# Patient Record
Sex: Female | Born: 1970 | Race: White | Hispanic: No | Marital: Married | Smoking: Never smoker
Health system: Southern US, Community
[De-identification: ages and names within clinical notes are randomized; demographics above are authoritative.]

## PROBLEM LIST (undated history)

## (undated) DIAGNOSIS — N951 Menopausal and female climacteric states: Secondary | ICD-10-CM

## (undated) DIAGNOSIS — G4700 Insomnia, unspecified: Secondary | ICD-10-CM

## (undated) DIAGNOSIS — F419 Anxiety disorder, unspecified: Secondary | ICD-10-CM

## (undated) DIAGNOSIS — N3010 Interstitial cystitis (chronic) without hematuria: Secondary | ICD-10-CM

## (undated) DIAGNOSIS — F32A Depression, unspecified: Secondary | ICD-10-CM

## (undated) DIAGNOSIS — K648 Other hemorrhoids: Secondary | ICD-10-CM

## (undated) DIAGNOSIS — K589 Irritable bowel syndrome without diarrhea: Secondary | ICD-10-CM

## (undated) DIAGNOSIS — F329 Major depressive disorder, single episode, unspecified: Secondary | ICD-10-CM

## (undated) DIAGNOSIS — K602 Anal fissure, unspecified: Secondary | ICD-10-CM

## (undated) HISTORY — PX: DILATION AND CURETTAGE OF UTERUS: SHX78

## (undated) HISTORY — PX: FISSURECTOMY: SHX5244

## (undated) HISTORY — DX: Interstitial cystitis (chronic) without hematuria: N30.10

## (undated) HISTORY — PX: SPHINCTEROTOMY: SHX5279

## (undated) HISTORY — DX: Anxiety disorder, unspecified: F41.9

## (undated) HISTORY — PX: DENTAL SURGERY: SHX609

## (undated) HISTORY — DX: Major depressive disorder, single episode, unspecified: F32.9

## (undated) HISTORY — DX: Irritable bowel syndrome, unspecified: K58.9

## (undated) HISTORY — DX: Depression, unspecified: F32.A

## (undated) HISTORY — DX: Insomnia, unspecified: G47.00

## (undated) HISTORY — DX: Menopausal and female climacteric states: N95.1

## (undated) HISTORY — PX: BREAST BIOPSY: SHX20

## (undated) HISTORY — DX: Anal fissure, unspecified: K60.2

## (undated) HISTORY — DX: Other hemorrhoids: K64.8

---

## 2000-10-16 ENCOUNTER — Ambulatory Visit (HOSPITAL_COMMUNITY): Admission: RE | Admit: 2000-10-16 | Discharge: 2000-10-16 | Payer: Self-pay | Admitting: Family Medicine

## 2000-10-16 ENCOUNTER — Encounter: Payer: Self-pay | Admitting: Family Medicine

## 2001-07-02 ENCOUNTER — Other Ambulatory Visit: Admission: RE | Admit: 2001-07-02 | Discharge: 2001-07-02 | Payer: Self-pay | Admitting: *Deleted

## 2001-11-07 ENCOUNTER — Encounter: Payer: Self-pay | Admitting: *Deleted

## 2001-11-07 ENCOUNTER — Ambulatory Visit (HOSPITAL_COMMUNITY): Admission: RE | Admit: 2001-11-07 | Discharge: 2001-11-07 | Payer: Self-pay | Admitting: *Deleted

## 2005-02-15 ENCOUNTER — Ambulatory Visit: Payer: Self-pay | Admitting: Family Medicine

## 2005-03-07 ENCOUNTER — Ambulatory Visit: Payer: Self-pay | Admitting: Internal Medicine

## 2005-03-16 ENCOUNTER — Ambulatory Visit: Payer: Self-pay | Admitting: Internal Medicine

## 2005-12-14 ENCOUNTER — Ambulatory Visit: Payer: Self-pay | Admitting: Internal Medicine

## 2006-01-18 ENCOUNTER — Ambulatory Visit: Payer: Self-pay | Admitting: Family Medicine

## 2006-01-19 ENCOUNTER — Encounter: Admission: RE | Admit: 2006-01-19 | Discharge: 2006-01-19 | Payer: Self-pay | Admitting: Family Medicine

## 2006-07-11 ENCOUNTER — Ambulatory Visit: Payer: Self-pay | Admitting: Family Medicine

## 2007-01-08 ENCOUNTER — Observation Stay (HOSPITAL_COMMUNITY): Admission: AD | Admit: 2007-01-08 | Discharge: 2007-01-09 | Payer: Self-pay | Admitting: Family Medicine

## 2007-01-08 ENCOUNTER — Ambulatory Visit: Payer: Self-pay | Admitting: Family Medicine

## 2007-01-09 ENCOUNTER — Ambulatory Visit: Payer: Self-pay | Admitting: Internal Medicine

## 2007-01-11 ENCOUNTER — Ambulatory Visit: Payer: Self-pay | Admitting: Family Medicine

## 2007-01-11 ENCOUNTER — Ambulatory Visit: Payer: Self-pay | Admitting: Internal Medicine

## 2007-01-11 ENCOUNTER — Ambulatory Visit: Payer: Self-pay | Admitting: Cardiovascular Disease

## 2007-02-18 ENCOUNTER — Ambulatory Visit: Payer: Self-pay | Admitting: Family Medicine

## 2007-02-19 ENCOUNTER — Encounter: Payer: Self-pay | Admitting: Family Medicine

## 2008-12-25 DIAGNOSIS — K602 Anal fissure, unspecified: Secondary | ICD-10-CM

## 2008-12-25 HISTORY — DX: Anal fissure, unspecified: K60.2

## 2009-04-10 ENCOUNTER — Ambulatory Visit: Payer: Self-pay | Admitting: Family Medicine

## 2009-04-10 DIAGNOSIS — S90859A Superficial foreign body, unspecified foot, initial encounter: Secondary | ICD-10-CM

## 2009-04-10 DIAGNOSIS — L089 Local infection of the skin and subcutaneous tissue, unspecified: Secondary | ICD-10-CM | POA: Insufficient documentation

## 2011-05-12 NOTE — Discharge Summary (Signed)
NAMESAHMYA, ARAI            ACCOUNT NO.:  [id number]   MEDICAL RECORD NO.:  [id number]          PATIENT TYPE:  OBV   LOCATION:  5152                         FACILITY:  [facility]   PHYSICIAN:  Beth Hernandez, MDDATE OF BIRTH:  [birth year]   DATE OF ADMISSION:  01/08/2007  DATE OF DISCHARGE:  01/09/2007                               DISCHARGE SUMMARY   DISCHARGE DIAGNOSES:  1. Abdominal and back discomfort likely secondary to irritable bowel      syndrome.  2. Low normal blood pressure at baseline.  3. Reported history of transient blood in stool, fecal occult blood      negative in hospital   HISTORY OF PRESENT ILLNESS:  Beth Hernandez is a 40 year old white female  with a history of hemorrhoids who presented with chief complaint of back  pain for two days.  She also noted some complaints of dizziness, bright  red blood per rectum, and stomach pain. She stated that she had a 1-  month history of stomach pain and back pain which occurred shortly after  bowel movements. Prior to that, she noted a 2-week history of diarrhea  which resolved with time and Imodium.  She had bright red blood per  rectum off and on the day prior to admission. She was admitted for  further evaluation and treatment.   PAST MEDICAL HISTORY:  1. Hemorrhoids.  2. Depression.   COURSE OF HOSPITALIZATION.:  ABDOMINAL AND BACK DISCOMFORT WITH BOWEL MOVEMENT:  The patient was  admitted. Upon further questioning, it was felt that the patient's  symptoms were most consistent with irritable bowel syndrome. Referral  was made for an outpatient GI followup with Dr. Lina Hernandez. Also of  note, the patient was fecal occult blood negative and remained  hemodynamically stable during this admission.   She was noted to have low-normal blood pressures during this admission  which were asymptomatic and consistent with the patient's baseline blood  pressure.   DISCHARGE MEDICATIONS:  1. Zoloft 100 mg p.o. daily.  2. Birth control pills as prior to admission.  3. Tetracycline 500 mg p.o. b.i.d.  4. Sulfacet cream as needed.   DISPOSITION:  The patient was instructed to follow up with Dr. Laury Hernandez in  1 week and contact the office for an appointment, and to follow up with  Dr. Lina Hernandez on Friday, January 18, at 11:15 a.m. She was noted to  follow up  sooner with Dr. Laury Hernandez if she develops fever over 101, complaints  including nausea, vomiting, diarrhea, weakness, or abdominal pain.   PERTINENT LABORATORIES AT TIME OF DISCHARGE:  BUN 8, creatinine 0.6,  hemoglobin 13.5.      Beth Craze, NP      Beth Rover. Felicity Coyer, MD  Electronically Signed    MO/MEDQ  D:  04/01/2007  T:  04/01/2007  Job:  [id number]

## 2011-05-12 NOTE — H&P (Signed)
Beth Hernandez, Beth Hernandez            ACCOUNT NO.:  [id number]   MEDICAL RECORD NO.:  [id number]          PATIENT TYPE:  OBV   LOCATION:  5152                         FACILITY:  [facility]   PHYSICIAN:  Lelon Perla, DO    DATE OF BIRTH:  [birth year]   DATE OF ADMISSION:  01/08/2007  DATE OF DISCHARGE:                              HISTORY & PHYSICAL   ADMITTING DIAGNOSIS:  Gastrointestinal bleed.   The patient is a 40 year old white female who presented to the office  today with back pain for 2 days, bright red blood per rectum, stomach  pain and dizziness.  She states that she had a 54-month history of pain  in the stomach and back shortly after a bowel movement, but states the  pain was preceded by a 2-week history of diarrhea, which resolved with  time and Imodium.  The patient states that the pain in the stomach,  after the bowel movement, is new and the pain in the back is also new.  She reports a new 2-day history of constant back pain that is dull,  achy, and seems different than the back pain she has had before.  She  also reports a lot of dizziness for the last 2 weeks that is not normal  for her and complains of bright red blood per rectum yesterday, which  she thought at the time was from her hemorrhoids that she has a history  of, but they are not bleeding, according to the patient.  She denies  hemoptysis and no nausea or vomiting.   PAST MEDICAL HISTORY:  1. Hemorrhoids.  2. Depression.   MEDICATIONS:  Include:  1. Zoloft 100 mg a day.  2. Birth control pills, Trivora.  3. She, on an as needed basis for rosacea, has used tetracycline and      Sulfacet.   ALLERGIES:  SHE IS ALLERGIC TO:  1. CIPRO.  2. KEFLEX, WHICH CAUSES HIVES.  3. PENICILLIN.   REVIEW OF SYSTEMS:  As above.   PHYSICAL EXAMINATION:  VITAL SIGNS:  Her weight is 108.4.  Pulse 76.  Respirations 20.  Blood pressure is 104/76.  Orthostatics were done  lying down a blood pressure is 110/70, pulse is  76.  Sitting, blood  pressure is 98/55 and standing 90/70.  Patient became extremely dizzy  and almost passed out while orthostatics were being done.  HEENT:  Head:  Normocephalic, atraumatic.  Eyes:  Pupils equal and  reactive to light.  Tympanic membranes are intact bilaterally.  Oropharynx is clear.  NECK:  Supple.  No JVD.  No bruits.  HEART:  Positive S1 and S2.  No murmurs are appreciated.  LUNGS:  Clear bilaterally.  No rales, rhonchi or wheezing.  ABDOMEN:  Soft and nontender.  No masses palpated.  No CVA tenderness.  No organomegaly.  RECTAL EXAM:  Was done with heme positive brown stool with small  external hemorrhoids seen that were not bleeding.  MUSCULOSKELETAL EXAM:  There are no abnormalities.   LABS:  Hemoglobin, by finger stick, was done and was 15.5.  UA was  negative.  Pregnancy test was  negative.   ASSESSMENT/PLAN:  Gastrointestinal bleed, secondary to the patient  becoming orthostatic.  The patient was admitted for 24-hour observation  to do serial hemoglobin and hematocrit and IV fluids.  She will be  discharged home tomorrow if stable and will get a gastrointestinal  workup as an outpatient.      Lelon Perla, DO     YRL/MEDQ  D:  01/08/2007  T:  01/09/2007  Job:  [id number]

## 2011-05-12 NOTE — Assessment & Plan Note (Signed)
[facility]                         GASTROENTEROLOGY OFFICE NOTE   TIYA, SCHRUPP                   MRN:          [id number]  DATE:01/11/2007                            DOB:          10-30-1971    Beth Hernandez is a very nice 40 year old white female who is here today as an  acute working because of acute abdominal pain.  She was admitted to  [facility] for observation 3 days ago after presenting with  low abdominal pain, rectal bleeding, dizziness.  These symptoms have  been gradually increasing for about a period of past 2 months.  She had  episode diarrhea over New Years, otherwise she had constipation.  There  has been abdominal pain after meals and low back pain, almost  constantly.  Her blood chemistries were normal.  We saw Roshanna in March  2006 for episode of large volume of hematochezia and family history of  colorectal neoplasia in her grandparent.  A colonoscopy at that time  showed 1st grade internal hemorrhoids, no evidence of colitis.   MEDICATIONS:  1. Zoloft 100 mg p.o. daily.  2. Trivora birth control pills daily.  3. Senokot.  4. Fiber pills.  5. Tylenol.   PAST HISTORY:  Significant for depression, D&C after delivery and she  had dental surgery.   FAMILY HISTORY:  Is positive for colon cancer in grandparents, breast  cancer paternal grandmother, alcoholism in father.   SOCIAL HISTORY:  Married, one child.  She is a Futures trader.  She keeps  children in her house.  She does not smoke, drinks alcohol occasionally.   REVIEW OF SYSTEMS:  Positive for sleeping problems, vision changes, back  pain.   PHYSICAL EXAMINATION:  Blood pressure 80/50, pulse of 58 and weight 108  pounds.  She appeared thin, frail, somewhat depressed.  Alert and oriented and very cooperative.  Sclerae is nonicteric.  NECK:  Was supple with no adenopathy.  LUNGS:  Were clear to auscultation.  COR:  With normal S1, normal S2.  ABDOMEN:   Was soft, scaphoid with decreased bowel sounds.  Tenderness in  the right lower quadrant but more so in the left upper quadrant around  the splenic flexure without rebound or mass.  RECTAL:  An anoscopic exam shows normal perianal area, 1st grade  internal hemorrhoids which were hyperemic with actually blood clot on  the internal hemorrhoid at 9 o'clock.  No active bleeding.  Stool itself  was Hemoccult negative.  EXTREMITIES:  No edema.   IMPRESSION:  A 40 year old white female with rather acute onset of  abdominal pain as well as chronic abdominal symptoms for past 2 months.  Rectal bleeding, clearly from hemorrhoids.  Rule out ischemic colitis,  rule out exacerbation of irritable bowel syndrome, rule out bacterial  overgrowht or viral gastroenteritis.  She appears to be depressed.   PLAN:  1. A CT scan of the abdomen and pelvis today to rule out ischemic      colitis or segmental colitis, specifically left splenic flexure.  2. Stay on full liquids for the next 48 hours.  3. Anusol HC suppositories nightly.  4.  At this point discontinue Senokot and fiber which may be      irritating.  5. After the CT scan has been obtained I will  discuss the results.      For now, i would like her to take Levsin sublingually 0.125 mg 3      times a day before meals.     Hedwig Morton. Juanda Chance, MD  Electronically Signed    DMB/MedQ  DD: 01/11/2007  DT: 01/11/2007  Job #: [id number]   cc:   Loreen Freud, M.D.  Willow Ora, MD

## 2012-01-24 ENCOUNTER — Encounter: Payer: Self-pay | Admitting: Internal Medicine

## 2012-02-16 ENCOUNTER — Encounter: Payer: Self-pay | Admitting: *Deleted

## 2012-02-19 ENCOUNTER — Encounter: Payer: Self-pay | Admitting: *Deleted

## 2012-02-27 ENCOUNTER — Encounter: Payer: Self-pay | Admitting: Internal Medicine

## 2012-02-27 ENCOUNTER — Ambulatory Visit (INDEPENDENT_AMBULATORY_CARE_PROVIDER_SITE_OTHER): Payer: 59 | Admitting: Internal Medicine

## 2012-02-27 VITALS — BP 98/50 | HR 68 | Ht 64.0 in | Wt 116.5 lb

## 2012-02-27 DIAGNOSIS — K5900 Constipation, unspecified: Secondary | ICD-10-CM

## 2012-02-27 DIAGNOSIS — K589 Irritable bowel syndrome without diarrhea: Secondary | ICD-10-CM

## 2012-02-27 NOTE — Patient Instructions (Addendum)
You will be due for a recall colonoscopy in 09/2014. We will send you a reminder in the mail when it gets closer to that time. Dr Laury Axon

## 2012-02-27 NOTE — Progress Notes (Signed)
Beth Hernandez Jan 29, 1971 MRN [id number]  History of Present Illness:  This is a 41 year old white female that irritable bowel syndrome with predominant constipation. She had a colonoscopy in March 2006 with findings of internal hemorrhoids. She has a positive family history of colon cancer in a grandparent and history of colon polyps in her mother. Her twin sister just had breast cancer. Patient is concerned about needing  another colonoscopy. She is 41 years old. She has difficulty evacuating stool. She takes MiraLax 17 g daily and stool softeners. She has recently changed her dietary habits to include more fiber.   Past Medical History  Diagnosis Date  . Depression   . Internal hemorrhoids   . Insomnia   . Perimenopause   . Anal fissure 2010  . Anxiety   . Cystitis, interstitial   . IBS (irritable bowel syndrome)    Past Surgical History  Procedure Date  . Dilation and curettage of uterus   . Dental surgery   . Fissurectomy     with sphincterotomy    reports that she has never smoked. She has never used smokeless tobacco. She reports that she drinks alcohol. She reports that she does not use illicit drugs. family history includes Alcohol abuse in her father; Breast cancer in her paternal aunt, paternal grandmother, and sister; Colon cancer in her maternal grandmother; Colon polyps in her mother; Diabetes in her maternal aunt; Esophageal cancer in her maternal grandfather; Irritable bowel syndrome in her father; Lung cancer in her maternal aunt; and Other in her father. Allergies  Allergen Reactions  . Cephalexin     REACTION: hives  . Ciprofloxacin     REACTION: mental problems  . Penicillins     REACTION: itching and tingling        Review of Systems: Denies heartburn dysphagia or chest pain or shortness of breath  The remainder of the 10 point ROS is negative except as outlined in H&P   Physical Exam: General appearance  Well developed, in no distress. Eyes- non  icteric. HEENT nontraumatic, normocephalic. Mouth no lesions, tongue papillated, no cheilosis. Neck supple without adenopathy, thyroid not enlarged, no carotid bruits, no JVD. Lungs Clear to auscultation bilaterally. Cor normal S1, normal S2, regular rhythm, no murmur,  quiet precordium. Abdomen: Soft nontender with normal active bowel sounds. No distention. Liver edge at costal margin. No palpable mass Rectal: Normal rectal sphincter tone. Palpable heart nodule in the anal canal likely from a hemorrhoid. Stool is solid Hemoccult-negative. There is no prolapse Extremities no pedal edema. Skin no lesions. Neurological alert and oriented x 3. Psychological normal mood and affect.  Assessment and Plan:  Problem #1 Irritable bowel syndrome with predominant constipation. She will continue MiraLax 17 g daily and we also discussed adding a probiotic, one daily. I have offered Amitiza but she thinks she already tried in the past.  Problem #2 Family history of colorectal cancer in a direct relative. There is a positive family history of colon polyps in a direct relative. She is only 41 years old. I told her that I would not do a colonoscopy before age 69 unless she has specific symptoms that require colonoscopy. We will send her a recall letter in 2015.   02/27/2012 Beth Hernandez

## 2013-06-04 ENCOUNTER — Other Ambulatory Visit (HOSPITAL_BASED_OUTPATIENT_CLINIC_OR_DEPARTMENT_OTHER): Payer: Self-pay | Admitting: *Deleted

## 2013-06-04 ENCOUNTER — Other Ambulatory Visit (HOSPITAL_COMMUNITY): Payer: Self-pay | Admitting: *Deleted

## 2013-06-04 DIAGNOSIS — N926 Irregular menstruation, unspecified: Secondary | ICD-10-CM

## 2013-06-06 ENCOUNTER — Ambulatory Visit (HOSPITAL_BASED_OUTPATIENT_CLINIC_OR_DEPARTMENT_OTHER)
Admission: RE | Admit: 2013-06-06 | Discharge: 2013-06-06 | Disposition: A | Discharge status: Home / Self Care | Payer: 59 | Source: Ambulatory Visit | Attending: *Deleted | Admitting: *Deleted

## 2013-06-06 DIAGNOSIS — D250 Submucous leiomyoma of uterus: Secondary | ICD-10-CM | POA: Insufficient documentation

## 2013-06-06 DIAGNOSIS — N926 Irregular menstruation, unspecified: Secondary | ICD-10-CM | POA: Insufficient documentation

## 2013-06-10 ENCOUNTER — Other Ambulatory Visit (HOSPITAL_BASED_OUTPATIENT_CLINIC_OR_DEPARTMENT_OTHER): Payer: Self-pay | Admitting: *Deleted

## 2013-06-10 DIAGNOSIS — N83202 Unspecified ovarian cyst, left side: Secondary | ICD-10-CM

## 2013-07-30 ENCOUNTER — Other Ambulatory Visit: Payer: Self-pay | Admitting: Family

## 2013-08-01 ENCOUNTER — Other Ambulatory Visit (HOSPITAL_BASED_OUTPATIENT_CLINIC_OR_DEPARTMENT_OTHER): Payer: Self-pay | Admitting: *Deleted

## 2013-08-01 DIAGNOSIS — N83202 Unspecified ovarian cyst, left side: Secondary | ICD-10-CM

## 2013-08-04 ENCOUNTER — Ambulatory Visit (HOSPITAL_BASED_OUTPATIENT_CLINIC_OR_DEPARTMENT_OTHER)
Admission: RE | Admit: 2013-08-04 | Discharge: 2013-08-04 | Disposition: A | Discharge status: Home / Self Care | Payer: 59 | Source: Ambulatory Visit | Attending: *Deleted | Admitting: *Deleted

## 2013-08-04 ENCOUNTER — Ambulatory Visit (HOSPITAL_BASED_OUTPATIENT_CLINIC_OR_DEPARTMENT_OTHER): Admission: RE | Admit: 2013-08-04 | Payer: 59 | Source: Ambulatory Visit | Attending: *Deleted | Admitting: *Deleted

## 2013-08-04 DIAGNOSIS — N83202 Unspecified ovarian cyst, left side: Secondary | ICD-10-CM

## 2013-08-04 DIAGNOSIS — D259 Leiomyoma of uterus, unspecified: Secondary | ICD-10-CM | POA: Insufficient documentation

## 2013-08-04 DIAGNOSIS — N83209 Unspecified ovarian cyst, unspecified side: Secondary | ICD-10-CM | POA: Insufficient documentation

## 2013-08-10 ENCOUNTER — Other Ambulatory Visit (HOSPITAL_BASED_OUTPATIENT_CLINIC_OR_DEPARTMENT_OTHER): Payer: 59

## 2014-09-03 ENCOUNTER — Encounter: Payer: Self-pay | Admitting: Internal Medicine

## 2014-09-23 ENCOUNTER — Encounter: Payer: Self-pay | Admitting: Internal Medicine

## 2014-12-09 ENCOUNTER — Encounter: Payer: 59 | Admitting: Internal Medicine

## 2014-12-22 ENCOUNTER — Ambulatory Visit (AMBULATORY_SURGERY_CENTER): Payer: Self-pay | Admitting: *Deleted

## 2014-12-22 VITALS — Ht 63.75 in | Wt 104.8 lb

## 2014-12-22 DIAGNOSIS — Z800 Family history of malignant neoplasm of digestive organs: Secondary | ICD-10-CM

## 2014-12-22 MED ORDER — MOVIPREP 100 G PO SOLR: 1.0000 | Freq: Once | ORAL | Status: DC

## 2014-12-22 NOTE — Progress Notes (Signed)
No egg or soy allergy. ewm No issues with past sedation. ewm No diet pills, no blood thinners. ewm No home 02 use. ewm Pt declined emmi video. ewm

## 2014-12-23 ENCOUNTER — Encounter: Payer: Self-pay | Admitting: Internal Medicine

## 2015-01-06 ENCOUNTER — Ambulatory Visit (AMBULATORY_SURGERY_CENTER): Payer: 59 | Admitting: Internal Medicine

## 2015-01-06 ENCOUNTER — Encounter: Payer: Self-pay | Admitting: Internal Medicine

## 2015-01-06 VITALS — BP 111/70 | HR 70 | Temp 96.8°F | Resp 18 | Ht 63.0 in | Wt 104.0 lb

## 2015-01-06 DIAGNOSIS — Z1211 Encounter for screening for malignant neoplasm of colon: Secondary | ICD-10-CM

## 2015-01-06 DIAGNOSIS — Z800 Family history of malignant neoplasm of digestive organs: Secondary | ICD-10-CM

## 2015-01-06 MED ORDER — SODIUM CHLORIDE 0.9 % IV SOLN: 500.0000 mL | INTRAVENOUS | Status: DC

## 2015-01-06 NOTE — Op Note (Signed)
[facility]  [address]. [place], [address]   COLONOSCOPY PROCEDURE REPORT  PATIENT: Beth Hernandez, Beth Hernandez  MR#: [id number] BIRTHDATE: 1971-09-26 , 13  yrs. old GENDER: female ENDOSCOPIST: Lafayette Dragon, MD REFERRED BY:Dr Chase Caller PROCEDURE DATE:  01/06/2015 PROCEDURE:   Colonoscopy, screening First Screening Colonoscopy - Avg.  risk and is 50 yrs.  old or older - No.  Prior Negative Screening - Now for repeat screening. 10 or more years since last screening  History of Adenoma - Now for follow-up colonoscopy & has been > or = to 3 yrs.  N/A  Polyps Removed Today? No.  Polyps Removed Today? No.  Recommend repeat exam, <10 yrs? Polyps Removed Today? No.  Recommend repeat exam, <10 yrs? No. ASA CLASS:   Class I INDICATIONS:history of colon cancer in a grandparent.  Positive family history of colon polyps in patient's mother.  Last colonoscopy March 2006 was normal. MEDICATIONS: Monitored anesthesia care and Propofol 300 mg IV  DESCRIPTION OF PROCEDURE:   After the risks benefits and alternatives of the procedure were thoroughly explained, informed consent was obtained.  The digital rectal exam revealed no abnormalities of the rectum.   The LB [id number]  endoscope was introduced through the anus and advanced to the cecum, which was identified by both the appendix and ileocecal valve. No adverse events experienced.   The quality of the prep was good, using MoviPrep  The instrument was then slowly withdrawn as the colon was fully examined.      COLON FINDINGS: There was mild diverticulosis noted in the ascending colon.  Retroflexed views revealed no abnormalities. The time to cecum=8 minutes 31 seconds.  Withdrawal time=7 minutes 03 seconds. The scope was withdrawn and the procedure completed. COMPLICATIONS: There were no immediate complications.  ENDOSCOPIC IMPRESSION: There was mild diverticulosis noted in the ascending  colon  RECOMMENDATIONS: High-fiber diet Recall colonoscopy in 10 years  eSigned:  Lafayette Dragon, MD 01/06/2015 10:41 AM   cc:

## 2015-01-06 NOTE — Patient Instructions (Signed)

## 2015-01-06 NOTE — Progress Notes (Signed)
Stable to RR 

## 2015-01-07 ENCOUNTER — Telehealth: Payer: Self-pay

## 2015-01-07 NOTE — Telephone Encounter (Signed)
Left message on answering machine. 

## 2015-01-11 ENCOUNTER — Telehealth: Payer: Self-pay | Admitting: Internal Medicine

## 2015-01-11 NOTE — Telephone Encounter (Signed)
Spoke with patient and she states she is having gas but has not passed any stool. Reassured patient that it may take up to a week for stool.

## 2015-10-27 ENCOUNTER — Ambulatory Visit (INDEPENDENT_AMBULATORY_CARE_PROVIDER_SITE_OTHER): Payer: 59 | Admitting: Osteopathic Medicine

## 2015-10-27 ENCOUNTER — Encounter: Payer: Self-pay | Admitting: Osteopathic Medicine

## 2015-10-27 ENCOUNTER — Ambulatory Visit (INDEPENDENT_AMBULATORY_CARE_PROVIDER_SITE_OTHER): Payer: 59

## 2015-10-27 VITALS — BP 98/58 | HR 90 | Temp 99.3°F | Wt 104.0 lb

## 2015-10-27 DIAGNOSIS — J189 Pneumonia, unspecified organism: Secondary | ICD-10-CM | POA: Insufficient documentation

## 2015-10-27 DIAGNOSIS — N3010 Interstitial cystitis (chronic) without hematuria: Secondary | ICD-10-CM | POA: Insufficient documentation

## 2015-10-27 DIAGNOSIS — R509 Fever, unspecified: Secondary | ICD-10-CM | POA: Diagnosis not present

## 2015-10-27 DIAGNOSIS — R059 Cough, unspecified: Secondary | ICD-10-CM

## 2015-10-27 DIAGNOSIS — R05 Cough: Secondary | ICD-10-CM | POA: Diagnosis not present

## 2015-10-27 DIAGNOSIS — J208 Acute bronchitis due to other specified organisms: Secondary | ICD-10-CM

## 2015-10-27 DIAGNOSIS — R5383 Other fatigue: Secondary | ICD-10-CM

## 2015-10-27 DIAGNOSIS — K581 Irritable bowel syndrome with constipation: Secondary | ICD-10-CM | POA: Insufficient documentation

## 2015-10-27 DIAGNOSIS — G4700 Insomnia, unspecified: Secondary | ICD-10-CM | POA: Insufficient documentation

## 2015-10-27 DIAGNOSIS — F32A Depression, unspecified: Secondary | ICD-10-CM | POA: Insufficient documentation

## 2015-10-27 DIAGNOSIS — N951 Menopausal and female climacteric states: Secondary | ICD-10-CM | POA: Insufficient documentation

## 2015-10-27 DIAGNOSIS — F329 Major depressive disorder, single episode, unspecified: Secondary | ICD-10-CM | POA: Insufficient documentation

## 2015-10-27 MED ORDER — AZITHROMYCIN 250 MG PO TABS: ORAL_TABLET | ORAL | Status: DC

## 2015-10-27 MED ORDER — PREDNISONE 10 MG (21) PO TBPK: ORAL_TABLET | ORAL | Status: DC

## 2015-10-27 NOTE — Patient Instructions (Signed)
Viral Infections °A viral infection can be caused by different types of viruses. Most viral infections are not serious and resolve on their own. However, some infections may cause severe symptoms and may lead to further complications. °SYMPTOMS °Viruses can frequently cause: °· Minor sore throat. °· Aches and pains. °· Headaches. °· Runny nose. °· Different types of rashes. °· Watery eyes. °· Tiredness. °· Cough. °· Loss of appetite. °· Gastrointestinal infections, resulting in nausea, vomiting, and diarrhea. °These symptoms do not respond to antibiotics because the infection is not caused by bacteria. However, you might catch a bacterial infection following the viral infection. This is sometimes called a "superinfection." Symptoms of such a bacterial infection may include: °· Worsening sore throat with pus and difficulty swallowing. °· Swollen neck glands. °· Chills and a high or persistent fever. °· Severe headache. °· Tenderness over the sinuses. °· Persistent overall ill feeling (malaise), muscle aches, and tiredness (fatigue). °· Persistent cough. °· Yellow, green, or brown mucus production with coughing. °HOME CARE INSTRUCTIONS  °· Only take over-the-counter or prescription medicines for pain, discomfort, diarrhea, or fever as directed by your caregiver. °· Drink enough water and fluids to keep your urine clear or pale yellow. Sports drinks can provide valuable electrolytes, sugars, and hydration. °· Get plenty of rest and maintain proper nutrition. Soups and broths with crackers or rice are fine. °SEEK IMMEDIATE MEDICAL CARE IF:  °· You have severe headaches, shortness of breath, chest pain, neck pain, or an unusual rash. °· You have uncontrolled vomiting, diarrhea, or you are unable to keep down fluids. °· You or your child has an oral temperature above 102° F (38.9° C), not controlled by medicine. °· Your baby is older than 3 months with a rectal temperature of 102° F (38.9° C) or higher. °· Your baby is 3  months old or younger with a rectal temperature of 100.4° F (38° C) or higher. °MAKE SURE YOU:  °· Understand these instructions. °· Will watch your condition. °· Will get help right away if you are not doing well or get worse. °  °This information is not intended to replace advice given to you by your health care provider. Make sure you discuss any questions you have with your health care provider. °  °Document Released: 09/20/2005 Document Revised: 03/04/2012 Document Reviewed: 05/19/2015 °Elsevier Interactive Patient Education ©2016 [employer] ° °

## 2015-10-27 NOTE — Progress Notes (Signed)
HPI: Beth Hernandez is a 44 y.o. female who presents to [facility]  today for chief complaint of:  Chief Complaint  Patient presents with  . New Patient (Initial Visit)  . Fever  . Chills   . Location: lungs, chest . Quality: ribs on R hurts to breathe . Severity: moderate . Duration: 1 day . Timing: constant . 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 Modifying factors: OTC Ibuprofen  . Assoc signs/symptoms: R sided pain, lost voice, coughing, (+) fever/chills up to 100 degrees, no abx past 3 months   Past medical, social and family history reviewed: Past Medical History  Diagnosis Date  . Depression   . Internal hemorrhoids   . Insomnia   . Perimenopause   . Anal fissure 2010  . Anxiety   . Cystitis, interstitial   . IBS (irritable bowel syndrome)    Past Surgical History  Procedure Laterality Date  . Dilation and curettage of uterus    . Dental surgery      skin grafts to gums   . Fissurectomy      with sphincterotomy  . Sphincterotomy     Social History  Substance Use Topics  . Smoking status: Never Smoker   . Smokeless tobacco: Never Used  . Alcohol Use: No   Family History  Problem Relation Age of Onset  . Colon cancer Maternal Grandmother   . Breast cancer Paternal Grandmother   . Alcohol abuse Father   . Irritable bowel syndrome Father   . Other Father     iliac disease  . Breast cancer Sister   . Breast cancer Paternal Aunt     had Br ca x2  . Esophageal cancer Maternal Grandfather   . Lung cancer Maternal Aunt   . Colon polyps Mother   . Diabetes Maternal Aunt   . Rectal cancer Neg Hx   . Stomach cancer Neg Hx     Current Outpatient Prescriptions  Medication Sig Dispense Refill  . ALPRAZolam (XANAX) 0.25 MG tablet Take 0.25 mg by mouth.    . Amphetamine Sulfate (EVEKEO) 10 MG TABS Take 10 mg by mouth.    . Ascorbic Acid (VITAMIN C) 1000 MG tablet Take 1,000 mg by mouth daily.      Marland Kitchen atomoxetine (STRATTERA) 60 MG capsule Take 60 mg by mouth daily.     . Calcium Glycerophosphate (PRELIEF) 340 (65-50) MG (CA-P) TABS Take 4 tablets by mouth 2 (two) times daily before a meal.     . Cholecalciferol (VITAMIN D) 400 UNITS capsule Take 5,000 Int'l Units by mouth daily.     Marland Kitchen docusate sodium (COLACE) 100 MG capsule Take 100 mg by mouth daily.     . hyoscyamine (LEVSIN SL) 0.125 MG SL tablet Take 0.125 mg by mouth every 4 (four) hours as needed.     . Magnesium Oxide 400 MG CAPS Take 1 tablet by mouth daily.     . Omega-3 Fatty Acids (FISH OIL) 500 MG CAPS Take 1,000 mg by mouth daily.     . polyethylene glycol (MIRALAX / GLYCOLAX) packet Take 17 g by mouth daily.     . Probiotic Product (ACIDOPHILUS) 90-25 MG CHEW Chew 1 capsule by mouth daily.     . Sulfacetamide Sodium-Sulfur 10-5 % LOTN Apply daily to face    . UNABLE TO FIND 800 mcg. megafolinic    . Vortioxetine HBr 10 MG TABS Take 10 mg by mouth.    Marland Kitchen  zaleplon (SONATA) 10 MG capsule Take 20 mg by mouth at bedtime.      No current facility-administered medications for this visit.   Allergies  Allergen Reactions  . Cephalexin     REACTION: hives  . Ciprofloxacin     REACTION: mental problems  . Penicillins     REACTION: itching and tingling  . Macrobid  [employer] Macro] Itching and Rash      Review of Systems: CONSTITUTIONAL:  Yes  fever, yes chills, No  unintentional weight changes HEAD/EYES/EARS/NOSE/THROAT: No headache, no vision change, no hearing change, Yes  sore throat CARDIAC: Positive chest pain with deep breath, no pressure/palpitations, no orthopnea RESPIRATORY: Yes  cough, Yes  shortness of breath/wheeze GASTROINTESTINAL: Positive nausea, no vomiting, no abdominal pain, no blood in stool, no diarrhea, no constipation MUSCULOSKELETAL: Yes  myalgia/arthralgia GENITOURINARY: No incontinence, No abnormal genital bleeding/discharge SKIN: No rash/wounds/concerning lesions HEM/ONC: No easy  bruising/bleeding, no abnormal lymph node ENDOCRINE: No polyuria/polydipsia/polyphagia, no heat/cold intolerance  NEUROLOGIC: Positive generalized weakness, no dizziness, no slurred speech PSYCHIATRIC: (+) concerns with depression, no concerns with anxiety, no sleep problems    Exam:  BP 98/58 mmHg  Pulse 90  Temp(Src) 99.3 F (37.4 C)  Wt 104 lb (47.174 kg) Constitutional: VSS, see above. General Appearance: alert, well-developed, well-nourished, fatigued, moving and speaking slowly Eyes: Normal lids and conjunctive, non-icteric sclera, PERRLA Ears, Nose, Mouth, Throat: Normal external inspection ears/nares/mouth/lips/gums, TM normal bilaterally, MMM, posterior pharynx Yes  erythema No  exudate Neck: No masses, trachea midline. No thyroid enlargement/tenderness/mass appreciated. No lymphadenopathy Respiratory: Normal respiratory effort. no wheeze, no rhonchi, no rales Cardiovascular: S1/S2 normal, no murmur, no rub/gallop auscultated. RRR.  No carotid bruit or JVD. No abdominal aortic bruit.  Pedal pulse II/IV bilaterally DP and PT.  No lower extremity edema. Gastrointestinal: Nontender, no masses. Musculoskeletal: Gait normal. No clubbing/cyanosis of digits. Tenderness to right posterior ribs on deep inhalation, normal range of motion in right shoulder and leg the patient reports some tenderness in the right eye, no masses or subcutaneous lesion, Psychiatric: Normal judgment/insight. Depressed mood and affect likely secondary to illness. Oriented x3.   LABS REVIEWED:  Pap NILM, no HPV tested 12/2014 TSH, CBC, normal 09/2014 Cl mild low, ALT mild high (40) 09/2014   No results found for this or any previous visit (from the past 72 hour(s)).    ASSESSMENT/PLAN:  CAP (community acquired pneumonia) - Plan: azithromycin (ZITHROMAX) 250 MG tablet radiology over read came back as right middle lobe pneumonia, call patient with this update, called in Z-Pak, all questions answered, still  some concern for influenza, patient declines oseltamivir  Cough - Plan: DG Chest 2 View - personal review chest x-ray appears clear, questionable inflammation=, no significant mass/lobar pneumonia seen by myself, see radiology report above.  Viral bronchitis  Other specified fever  Other fatigue - likely viral syndrome, supportive care, good hydration and po nutrition as tolerated.     Return in about 4 weeks (around 11/24/2015), or if symptoms worsen or fail to improve, for other chronic medical issues.

## 2015-10-29 ENCOUNTER — Telehealth: Payer: Self-pay | Admitting: Osteopathic Medicine

## 2015-10-29 NOTE — Telephone Encounter (Signed)
Patient walked into clinic today questioning if she was having an allergic reaction to the Prednisone she was just placed on. Pt reports earlier today when she woke up her entire face was flushed, then after a nap this afternoon her face and neck were flushed. She is on day 2 of the dose pack.  Pt also is requesting a change in her return to work note. She was put to return on Monday 11/01/15. Pt is requesting to return Monday and Tuesday for half days then return full duty on Wednesday. 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

## 2015-10-29 NOTE — Telephone Encounter (Signed)
Normal adverse effects from prednisone, push through it, also I will write a note.

## 2015-10-29 NOTE — Telephone Encounter (Signed)
Note given to Pt, and Pt advised of normal reaction to prednisone.

## 2015-12-13 ENCOUNTER — Telehealth: Payer: Self-pay

## 2015-12-13 NOTE — Telephone Encounter (Signed)
I did not treat her on the 15th, she would need a work note from whoever did or would need the records forwarded t ome before I could write a note for that. I can provide a note from the day I saw & treated her 10/27/15.

## 2015-12-13 NOTE — Telephone Encounter (Signed)
CALLED PATIENT LEFT A MESSAGE ON VM EXPLAINING INFORMATION NOTED BELOW. PATIENT DO HAVE AND UPCOMING APPT AND WILL ADVISE AGAIN AT THAT POINT. Rhonda Cunningham,CMA

## 2015-12-13 NOTE — Telephone Encounter (Signed)
PATIENT CALLED STATED THAT SHE WAS SEEN ON THE 15TH FOR SWOLLEN LYMPHNODES AND SHE IS REQUESTING A WORK NOTE FROM PCP AND PATIENT WAS ADVISED THAT HER LAST VISIT WAS 10/27/15 AND IF SHE NEEDED A NOTE FROM PCP IT WOULD HAVE 10/27/15 ON IT. PLEASE ADVISE. Steven Basso,CMA

## 2015-12-14 ENCOUNTER — Ambulatory Visit: Payer: 59

## 2016-01-03 MED FILL — TRINTELLIX 20 MG TABLET: 20 | 30 days supply | Qty: 30 | Fill #0

## 2016-01-03 MED FILL — EVEKEO 10 MG TABLET: 10 | 30 days supply | Qty: 60 | Fill #0

## 2016-01-03 MED FILL — ZALEPLON 10 MG CAPSULE: 10 | 30 days supply | Qty: 60 | Fill #1

## 2016-01-27 MED FILL — STRATTERA 60 MG CAPSULE: 60 | 30 days supply | Qty: 30 | Fill #1

## 2016-02-01 MED FILL — EVEKEO 10 MG TABLET: 10 | 30 days supply | Qty: 60 | Fill #0

## 2016-02-02 MED FILL — TRINTELLIX 20 MG TABLET: 20 | 30 days supply | Qty: 30 | Fill #1

## 2016-02-03 MED FILL — ZALEPLON 10 MG CAPSULE: 10 | 30 days supply | Qty: 60 | Fill #2

## 2016-02-14 MED FILL — POLYETHYLENE GLYCOL 3350 PO: 30 days supply | Qty: 527 | Fill #1

## 2016-02-15 DIAGNOSIS — Z79899 Other long term (current) drug therapy: Secondary | ICD-10-CM | POA: Diagnosis not present

## 2016-02-15 DIAGNOSIS — F1920 Other psychoactive substance dependence, uncomplicated: Secondary | ICD-10-CM | POA: Diagnosis not present

## 2016-02-15 DIAGNOSIS — F902 Attention-deficit hyperactivity disorder, combined type: Secondary | ICD-10-CM | POA: Diagnosis not present

## 2016-02-15 MED FILL — ADZENYS XR-ODT 6.3 MG TAB: 6.3 | 30 days supply | Qty: 30 | Fill #0

## 2016-02-18 DIAGNOSIS — Z1231 Encounter for screening mammogram for malignant neoplasm of breast: Secondary | ICD-10-CM | POA: Diagnosis not present

## 2016-02-22 DIAGNOSIS — Z0000 Encounter for general adult medical examination without abnormal findings: Secondary | ICD-10-CM | POA: Diagnosis not present

## 2016-02-22 DIAGNOSIS — L309 Dermatitis, unspecified: Secondary | ICD-10-CM | POA: Diagnosis not present

## 2016-02-22 DIAGNOSIS — B351 Tinea unguium: Secondary | ICD-10-CM | POA: Diagnosis not present

## 2016-02-22 DIAGNOSIS — R5383 Other fatigue: Secondary | ICD-10-CM | POA: Diagnosis not present

## 2016-02-22 DIAGNOSIS — F3341 Major depressive disorder, recurrent, in partial remission: Secondary | ICD-10-CM | POA: Diagnosis not present

## 2016-02-22 DIAGNOSIS — F902 Attention-deficit hyperactivity disorder, combined type: Secondary | ICD-10-CM | POA: Diagnosis not present

## 2016-02-22 DIAGNOSIS — F5104 Psychophysiologic insomnia: Secondary | ICD-10-CM | POA: Diagnosis not present

## 2016-02-22 DIAGNOSIS — F419 Anxiety disorder, unspecified: Secondary | ICD-10-CM | POA: Diagnosis not present

## 2016-02-22 MED FILL — FLUOCINONIDE 0.05% OINTMENT: 0.05 | 30 days supply | Qty: 60 | Fill #0

## 2016-02-23 MED FILL — STRATTERA 60 MG CAPSULE: 60 | 30 days supply | Qty: 30 | Fill #2

## 2016-02-23 MED FILL — TRINTELLIX 20 MG TABLET: 20 | 30 days supply | Qty: 30 | Fill #2

## 2016-02-25 MED FILL — EVEKEO 10 MG TABLET: 10 | 30 days supply | Qty: 60 | Fill #0

## 2016-03-03 DIAGNOSIS — H109 Unspecified conjunctivitis: Secondary | ICD-10-CM | POA: Diagnosis not present

## 2016-03-06 MED FILL — ZALEPLON 10 MG CAPSULE: 10 | 30 days supply | Qty: 60 | Fill #3

## 2016-03-20 DIAGNOSIS — Z20818 Contact with and (suspected) exposure to other bacterial communicable diseases: Secondary | ICD-10-CM | POA: Diagnosis not present

## 2016-03-20 DIAGNOSIS — R509 Fever, unspecified: Secondary | ICD-10-CM | POA: Diagnosis not present

## 2016-03-20 DIAGNOSIS — R52 Pain, unspecified: Secondary | ICD-10-CM | POA: Diagnosis not present

## 2016-03-20 DIAGNOSIS — J0100 Acute maxillary sinusitis, unspecified: Secondary | ICD-10-CM | POA: Diagnosis not present

## 2016-03-27 MED FILL — STRATTERA 60 MG CAPSULE: 60 | 30 days supply | Qty: 30 | Fill #0

## 2016-03-27 MED FILL — TRINTELLIX 20 MG TABLET: 20 | 30 days supply | Qty: 30 | Fill #3

## 2016-04-03 MED FILL — ZALEPLON 10 MG CAPSULE: 10 | 30 days supply | Qty: 60 | Fill #4

## 2016-04-05 DIAGNOSIS — Z79899 Other long term (current) drug therapy: Secondary | ICD-10-CM | POA: Diagnosis not present

## 2016-04-05 MED FILL — EVEKEO 10 MG TABLET: 10 | 30 days supply | Qty: 60 | Fill #0

## 2016-04-24 MED FILL — POLYETHYLENE GLYCOL 3350 PO: 30 days supply | Qty: 527 | Fill #2

## 2016-04-24 MED FILL — TRINTELLIX 20 MG TABLET: 20 | 30 days supply | Qty: 30 | Fill #4

## 2016-04-24 MED FILL — STRATTERA 60 MG CAPSULE: 60 | 30 days supply | Qty: 30 | Fill #1

## 2016-05-02 DIAGNOSIS — R21 Rash and other nonspecific skin eruption: Secondary | ICD-10-CM | POA: Diagnosis not present

## 2016-05-02 DIAGNOSIS — T887XXA Unspecified adverse effect of drug or medicament, initial encounter: Secondary | ICD-10-CM | POA: Diagnosis not present

## 2016-05-04 MED FILL — EVEKEO 10 MG TABLET: 10 | 30 days supply | Qty: 60 | Fill #0

## 2016-05-04 MED FILL — ZALEPLON 10 MG CAPSULE: 10 | 30 days supply | Qty: 60 | Fill #5

## 2016-05-10 DIAGNOSIS — F338 Other recurrent depressive disorders: Secondary | ICD-10-CM | POA: Diagnosis not present

## 2016-05-10 DIAGNOSIS — F419 Anxiety disorder, unspecified: Secondary | ICD-10-CM | POA: Diagnosis not present

## 2016-05-10 DIAGNOSIS — F902 Attention-deficit hyperactivity disorder, combined type: Secondary | ICD-10-CM | POA: Diagnosis not present

## 2016-05-10 DIAGNOSIS — E039 Hypothyroidism, unspecified: Secondary | ICD-10-CM | POA: Diagnosis not present

## 2016-05-10 DIAGNOSIS — Z79899 Other long term (current) drug therapy: Secondary | ICD-10-CM | POA: Diagnosis not present

## 2016-05-10 DIAGNOSIS — Z72820 Sleep deprivation: Secondary | ICD-10-CM | POA: Diagnosis not present

## 2016-05-25 MED FILL — STRATTERA 60 MG CAPSULE: 60 | 30 days supply | Qty: 30 | Fill #2

## 2016-06-01 MED FILL — TRINTELLIX 20 MG TABLET: 20 | 30 days supply | Qty: 30 | Fill #5

## 2016-06-01 MED FILL — ZALEPLON 10 MG CAPSULE: 10 | 30 days supply | Qty: 60 | Fill #0

## 2016-06-05 MED FILL — EVEKEO 10 MG TABLET: 10 | 30 days supply | Qty: 60 | Fill #0

## 2016-06-05 MED FILL — POLYETHYLENE GLYCOL 3350 PO: 30 days supply | Qty: 527 | Fill #3

## 2016-06-22 MED FILL — STRATTERA 60 MG CAPSULE: 60 | 30 days supply | Qty: 30 | Fill #3

## 2016-06-26 MED FILL — TRINTELLIX 20 MG TABLET: 20 | 30 days supply | Qty: 30 | Fill #0

## 2016-06-30 DIAGNOSIS — F3341 Major depressive disorder, recurrent, in partial remission: Secondary | ICD-10-CM | POA: Diagnosis not present

## 2016-06-30 DIAGNOSIS — F419 Anxiety disorder, unspecified: Secondary | ICD-10-CM | POA: Diagnosis not present

## 2016-06-30 DIAGNOSIS — F902 Attention-deficit hyperactivity disorder, combined type: Secondary | ICD-10-CM | POA: Diagnosis not present

## 2016-06-30 DIAGNOSIS — F5104 Psychophysiologic insomnia: Secondary | ICD-10-CM | POA: Diagnosis not present

## 2016-06-30 MED FILL — TRINTELLIX 10 MG TABLET: 10 | 30 days supply | Qty: 30 | Fill #0

## 2016-06-30 MED FILL — BUPROPION HCL XL 150 MG TAB: 150 | 30 days supply | Qty: 30 | Fill #0

## 2016-07-05 MED FILL — EVEKEO 10 MG TABLET: 10 | 30 days supply | Qty: 60 | Fill #0

## 2016-07-05 MED FILL — ZALEPLON 10 MG CAPSULE: 10 | 30 days supply | Qty: 60 | Fill #1

## 2016-07-20 DIAGNOSIS — F419 Anxiety disorder, unspecified: Secondary | ICD-10-CM | POA: Diagnosis not present

## 2016-07-20 DIAGNOSIS — F902 Attention-deficit hyperactivity disorder, combined type: Secondary | ICD-10-CM | POA: Diagnosis not present

## 2016-07-20 DIAGNOSIS — F331 Major depressive disorder, recurrent, moderate: Secondary | ICD-10-CM | POA: Diagnosis not present

## 2016-07-20 DIAGNOSIS — F5104 Psychophysiologic insomnia: Secondary | ICD-10-CM | POA: Diagnosis not present

## 2016-07-25 MED FILL — ATOMOXETINE HCL 60 MG CAP: 60 | 30 days supply | Qty: 30 | Fill #0

## 2016-08-02 MED FILL — BUPROPION HCL XL 150 MG TAB: 150 | 30 days supply | Qty: 30 | Fill #1

## 2016-08-02 MED FILL — POLYETHYLENE GLYCOL 3350 PO: 30 days supply | Qty: 527 | Fill #0

## 2016-08-02 MED FILL — TRINTELLIX 10 MG TABLET: 10 | 30 days supply | Qty: 30 | Fill #1

## 2016-08-02 MED FILL — EVEKEO 10 MG TABLET: 10 | 30 days supply | Qty: 60 | Fill #0

## 2016-08-08 MED FILL — ZALEPLON 10 MG CAPSULE: 10 | 30 days supply | Qty: 60 | Fill #2

## 2016-08-24 MED FILL — ATOMOXETINE HCL 60 MG CAP: 60 | 30 days supply | Qty: 30 | Fill #1

## 2016-08-30 DIAGNOSIS — Z20818 Contact with and (suspected) exposure to other bacterial communicable diseases: Secondary | ICD-10-CM | POA: Diagnosis not present

## 2016-08-31 MED FILL — BUPROPION HCL XL 150 MG TAB: 150 | 30 days supply | Qty: 30 | Fill #0

## 2016-08-31 MED FILL — TRINTELLIX 10 MG TABLET: 10 | 30 days supply | Qty: 30 | Fill #0

## 2016-08-31 MED FILL — ZALEPLON 10 MG CAPSULE: 10 | 30 days supply | Qty: 60 | Fill #3

## 2016-09-01 MED FILL — EVEKEO 10 MG TABLET: 10 | 30 days supply | Qty: 60 | Fill #0

## 2016-09-07 DIAGNOSIS — F419 Anxiety disorder, unspecified: Secondary | ICD-10-CM | POA: Diagnosis not present

## 2016-09-07 DIAGNOSIS — E039 Hypothyroidism, unspecified: Secondary | ICD-10-CM | POA: Diagnosis not present

## 2016-09-07 DIAGNOSIS — F902 Attention-deficit hyperactivity disorder, combined type: Secondary | ICD-10-CM | POA: Diagnosis not present

## 2016-09-07 DIAGNOSIS — F338 Other recurrent depressive disorders: Secondary | ICD-10-CM | POA: Diagnosis not present

## 2016-09-07 DIAGNOSIS — Z72820 Sleep deprivation: Secondary | ICD-10-CM | POA: Diagnosis not present

## 2016-09-07 DIAGNOSIS — Z79899 Other long term (current) drug therapy: Secondary | ICD-10-CM | POA: Diagnosis not present

## 2016-09-07 MED FILL — ATOMOXETINE HCL 80 MG CAP: 80 | 30 days supply | Qty: 30 | Fill #0

## 2016-09-25 MED FILL — POLYETHYLENE GLYCOL 3350 PO: 30 days supply | Qty: 527 | Fill #1

## 2016-10-04 MED FILL — ATOMOXETINE HCL 80 MG CAP: 80 | 30 days supply | Qty: 30 | Fill #1

## 2016-10-04 MED FILL — EVEKEO 10 MG TABLET: 10 | 30 days supply | Qty: 60 | Fill #0

## 2016-10-04 MED FILL — TRINTELLIX 20 MG TABLET: 20 | 30 days supply | Qty: 30 | Fill #1

## 2016-10-05 MED FILL — ZALEPLON 10 MG CAPSULE: 10 | 30 days supply | Qty: 60 | Fill #0

## 2016-11-03 MED FILL — ATOMOXETINE HCL 80 MG CAP: 80 | 30 days supply | Qty: 30 | Fill #2

## 2016-11-06 MED FILL — EVEKEO 10 MG TABLET: 10 | 30 days supply | Qty: 60 | Fill #0

## 2016-11-08 MED FILL — ZALEPLON 10 MG CAPSULE: 10 | 30 days supply | Qty: 60 | Fill #1

## 2016-11-08 MED FILL — TRINTELLIX 20 MG TABLET: 20 | 30 days supply | Qty: 30 | Fill #2

## 2016-11-09 DIAGNOSIS — F339 Major depressive disorder, recurrent, unspecified: Secondary | ICD-10-CM | POA: Diagnosis not present

## 2016-11-09 DIAGNOSIS — F331 Major depressive disorder, recurrent, moderate: Secondary | ICD-10-CM | POA: Diagnosis not present

## 2016-11-09 DIAGNOSIS — F419 Anxiety disorder, unspecified: Secondary | ICD-10-CM | POA: Diagnosis not present

## 2016-11-23 MED FILL — POLYETHYLENE GLYCOL 3350 PO: 30 days supply | Qty: 527 | Fill #2

## 2016-12-05 MED FILL — EVEKEO 10 MG TABLET: 10 | 30 days supply | Qty: 60 | Fill #0

## 2016-12-06 MED FILL — ATOMOXETINE HCL 80 MG CAP: 80 | 30 days supply | Qty: 30 | Fill #0

## 2016-12-11 MED FILL — ZALEPLON 10 MG CAPSULE: 10 | 30 days supply | Qty: 60 | Fill #2

## 2016-12-11 MED FILL — TRINTELLIX 20 MG TABLET: 20 | 30 days supply | Qty: 30 | Fill #3

## 2016-12-27 DIAGNOSIS — E039 Hypothyroidism, unspecified: Secondary | ICD-10-CM | POA: Diagnosis not present

## 2016-12-27 DIAGNOSIS — F902 Attention-deficit hyperactivity disorder, combined type: Secondary | ICD-10-CM | POA: Diagnosis not present

## 2016-12-27 DIAGNOSIS — Z79899 Other long term (current) drug therapy: Secondary | ICD-10-CM | POA: Diagnosis not present

## 2016-12-27 DIAGNOSIS — F419 Anxiety disorder, unspecified: Secondary | ICD-10-CM | POA: Diagnosis not present

## 2016-12-27 DIAGNOSIS — F338 Other recurrent depressive disorders: Secondary | ICD-10-CM | POA: Diagnosis not present

## 2017-01-01 MED FILL — ATOMOXETINE HCL 80 MG CAP: 80 | 30 days supply | Qty: 30 | Fill #1

## 2017-01-02 MED FILL — EVEKEO 10 MG TABLET: 10 | 30 days supply | Qty: 60 | Fill #0

## 2017-01-09 MED FILL — ZALEPLON 10 MG CAPSULE: 10 | 30 days supply | Qty: 60 | Fill #3

## 2017-01-09 MED FILL — TRINTELLIX 20 MG TABLET: 20 | 30 days supply | Qty: 30 | Fill #4

## 2017-01-29 MED FILL — POLYETHYLENE GLYCOL 3350 PO: 30 days supply | Qty: 527 | Fill #3

## 2017-01-31 MED FILL — ATOMOXETINE HCL 80 MG CAP: 80 | 30 days supply | Qty: 30 | Fill #2

## 2017-01-31 MED FILL — EVEKEO 10 MG TABLET: 10 | 30 days supply | Qty: 60 | Fill #0

## 2017-02-01 DIAGNOSIS — H43393 Other vitreous opacities, bilateral: Secondary | ICD-10-CM | POA: Diagnosis not present

## 2017-02-01 DIAGNOSIS — H43813 Vitreous degeneration, bilateral: Secondary | ICD-10-CM | POA: Diagnosis not present

## 2017-02-01 DIAGNOSIS — H52223 Regular astigmatism, bilateral: Secondary | ICD-10-CM | POA: Diagnosis not present

## 2017-02-01 DIAGNOSIS — H5203 Hypermetropia, bilateral: Secondary | ICD-10-CM | POA: Diagnosis not present

## 2017-02-01 DIAGNOSIS — H524 Presbyopia: Secondary | ICD-10-CM | POA: Diagnosis not present

## 2017-02-02 DIAGNOSIS — B9789 Other viral agents as the cause of diseases classified elsewhere: Secondary | ICD-10-CM | POA: Diagnosis not present

## 2017-02-02 DIAGNOSIS — F339 Major depressive disorder, recurrent, unspecified: Secondary | ICD-10-CM | POA: Diagnosis not present

## 2017-02-02 DIAGNOSIS — Z803 Family history of malignant neoplasm of breast: Secondary | ICD-10-CM | POA: Diagnosis not present

## 2017-02-02 DIAGNOSIS — Z0000 Encounter for general adult medical examination without abnormal findings: Secondary | ICD-10-CM | POA: Diagnosis not present

## 2017-02-02 DIAGNOSIS — F5104 Psychophysiologic insomnia: Secondary | ICD-10-CM | POA: Diagnosis not present

## 2017-02-02 DIAGNOSIS — J069 Acute upper respiratory infection, unspecified: Secondary | ICD-10-CM | POA: Diagnosis not present

## 2017-02-02 MED FILL — TRINTELLIX 20 MG TABLET: 20 | 30 days supply | Qty: 30 | Fill #0

## 2017-02-19 DIAGNOSIS — Z1231 Encounter for screening mammogram for malignant neoplasm of breast: Secondary | ICD-10-CM | POA: Diagnosis not present

## 2017-03-05 MED FILL — ZALEPLON 10 MG CAPSULE: 10 | 30 days supply | Qty: 60 | Fill #0

## 2017-03-05 MED FILL — ATOMOXETINE HCL 80 MG CAP: 80 | 30 days supply | Qty: 30 | Fill #3

## 2017-03-05 MED FILL — EVEKEO 10 MG TABLET: 10 | 30 days supply | Qty: 60 | Fill #0

## 2017-03-05 MED FILL — TRINTELLIX 20 MG TABLET: 20 | 30 days supply | Qty: 30 | Fill #1

## 2017-03-16 DIAGNOSIS — F411 Generalized anxiety disorder: Secondary | ICD-10-CM | POA: Diagnosis not present

## 2017-03-16 DIAGNOSIS — F339 Major depressive disorder, recurrent, unspecified: Secondary | ICD-10-CM | POA: Diagnosis not present

## 2017-03-19 DIAGNOSIS — F3340 Major depressive disorder, recurrent, in remission, unspecified: Secondary | ICD-10-CM | POA: Diagnosis not present

## 2017-03-19 MED FILL — ARIPiprazole 2 MG TABS: 2 | 30 days supply | Qty: 30 | Fill #0

## 2017-04-04 MED FILL — ATOMOXETINE HCL 80 MG CAP: 80 | 30 days supply | Qty: 30 | Fill #4

## 2017-04-04 MED FILL — POLYETHYLENE GLYCOL 3350 PO: 30 days supply | Qty: 527 | Fill #4

## 2017-04-06 MED FILL — ZALEPLON 10 MG CAPSULE: 10 | 30 days supply | Qty: 60 | Fill #1

## 2017-04-10 MED FILL — TRINTELLIX 20 MG TABLET: 20 | 30 days supply | Qty: 30 | Fill #2

## 2017-04-20 DIAGNOSIS — F3340 Major depressive disorder, recurrent, in remission, unspecified: Secondary | ICD-10-CM | POA: Diagnosis not present

## 2017-04-20 MED FILL — ARIPiprazole 2 MG TABS: 2 | 30 days supply | Qty: 30 | Fill #0

## 2017-04-20 MED FILL — ALPRAZolam 0.25 MG TABS: 0.25 | 30 days supply | Qty: 30 | Fill #0

## 2017-05-02 MED FILL — ATOMOXETINE HCL 80 MG CAP: 80 | 30 days supply | Qty: 30 | Fill #5

## 2017-05-07 MED FILL — ZALEPLON 10 MG CAPSULE: 10 | 30 days supply | Qty: 60 | Fill #2

## 2017-05-07 MED FILL — TRINTELLIX 20 MG TABLET: 20 | 30 days supply | Qty: 30 | Fill #3

## 2017-05-24 MED FILL — DEXTROAMP-AMP 10 MG TAB: 10 | 30 days supply | Qty: 30 | Fill #0

## 2017-06-05 MED FILL — ATOMOXETINE HCL 80 MG CAP: 80 | 30 days supply | Qty: 30 | Fill #6

## 2017-06-05 MED FILL — POLYETHYLENE GLYCOL 3350 PO: 30 days supply | Qty: 527 | Fill #5

## 2017-06-05 MED FILL — ZALEPLON 10 MG CAPSULE: 10 | 30 days supply | Qty: 60 | Fill #3

## 2017-06-05 MED FILL — TRINTELLIX 20 MG TABLET: 20 | 30 days supply | Qty: 30 | Fill #4

## 2017-06-06 DIAGNOSIS — F338 Other recurrent depressive disorders: Secondary | ICD-10-CM | POA: Diagnosis not present

## 2017-06-06 DIAGNOSIS — F902 Attention-deficit hyperactivity disorder, combined type: Secondary | ICD-10-CM | POA: Diagnosis not present

## 2017-06-06 DIAGNOSIS — F419 Anxiety disorder, unspecified: Secondary | ICD-10-CM | POA: Diagnosis not present

## 2017-06-06 DIAGNOSIS — Z72820 Sleep deprivation: Secondary | ICD-10-CM | POA: Diagnosis not present

## 2017-06-06 DIAGNOSIS — Z79899 Other long term (current) drug therapy: Secondary | ICD-10-CM | POA: Diagnosis not present

## 2017-06-14 MED FILL — EVEKEO 10 MG TABLET: 10 | 30 days supply | Qty: 60 | Fill #0

## 2017-06-15 DIAGNOSIS — F3340 Major depressive disorder, recurrent, in remission, unspecified: Secondary | ICD-10-CM | POA: Diagnosis not present

## 2017-07-03 DIAGNOSIS — M940 Chondrocostal junction syndrome [Tietze]: Secondary | ICD-10-CM | POA: Diagnosis not present

## 2017-07-04 MED FILL — ATOMOXETINE HCL 80 MG CAP: 80 | 30 days supply | Qty: 30 | Fill #0

## 2017-07-06 MED FILL — ZALEPLON 10 MG CAPSULE: 10 | 30 days supply | Qty: 60 | Fill #4

## 2017-07-12 MED FILL — EVEKEO 10 MG TABLET: 10 | 30 days supply | Qty: 60 | Fill #0

## 2017-07-16 MED FILL — TRINTELLIX 20 MG TABLET: 20 | 30 days supply | Qty: 30 | Fill #5

## 2017-07-27 MED FILL — POLYETHYLENE GLYCOL 3350 PO: 30 days supply | Qty: 527 | Fill #6

## 2017-08-06 MED FILL — ATOMOXETINE HCL 80 MG CAP: 80 | 30 days supply | Qty: 30 | Fill #1

## 2017-08-08 MED FILL — ZALEPLON 10 MG CAPSULE: 10 | 30 days supply | Qty: 60 | Fill #0

## 2017-08-09 MED FILL — TRINTELLIX 20 MG TABLET: 20 | 30 days supply | Qty: 30 | Fill #0

## 2017-08-13 MED FILL — EVEKEO 10 MG TABLET: 10 | 30 days supply | Qty: 60 | Fill #0

## 2017-08-23 DIAGNOSIS — F3340 Major depressive disorder, recurrent, in remission, unspecified: Secondary | ICD-10-CM | POA: Diagnosis not present

## 2017-09-04 MED FILL — ATOMOXETINE HCL 80 MG CAP: 80 | 30 days supply | Qty: 30 | Fill #2

## 2017-09-05 MED FILL — ZALEPLON 10 MG CAPSULE: 10 | 30 days supply | Qty: 60 | Fill #1

## 2017-09-05 MED FILL — TRINTELLIX 20 MG TABLET: 20 | 30 days supply | Qty: 30 | Fill #1

## 2017-09-12 MED FILL — POLYETHYLENE GLYCOL 3350 PO: 30 days supply | Qty: 527 | Fill #0

## 2017-09-12 MED FILL — EVEKEO 10 MG TABLET: 10 | 30 days supply | Qty: 60 | Fill #0

## 2017-09-18 DIAGNOSIS — F419 Anxiety disorder, unspecified: Secondary | ICD-10-CM | POA: Diagnosis not present

## 2017-09-18 DIAGNOSIS — Z79899 Other long term (current) drug therapy: Secondary | ICD-10-CM | POA: Diagnosis not present

## 2017-09-18 DIAGNOSIS — F902 Attention-deficit hyperactivity disorder, combined type: Secondary | ICD-10-CM | POA: Diagnosis not present

## 2017-09-18 DIAGNOSIS — F338 Other recurrent depressive disorders: Secondary | ICD-10-CM | POA: Diagnosis not present

## 2017-10-04 MED FILL — ZALEPLON 10 MG CAPSULE: 10 | 30 days supply | Qty: 60 | Fill #2

## 2017-10-04 MED FILL — TRINTELLIX 20 MG TABLET: 20 | 30 days supply | Qty: 30 | Fill #0

## 2017-10-04 MED FILL — ATOMOXETINE HCL 80 MG CAPS: 80 | 30 days supply | Qty: 30 | Fill #3

## 2017-10-09 MED FILL — OSCIMIN SL 0.125 MG TABLET: 0.125 | 15 days supply | Qty: 60 | Fill #0

## 2017-10-15 MED FILL — AMPHETAMINE SULFATE 10 MG T: 10 | 30 days supply | Qty: 60 | Fill #0

## 2017-10-29 DIAGNOSIS — F3340 Major depressive disorder, recurrent, in remission, unspecified: Secondary | ICD-10-CM | POA: Diagnosis not present

## 2017-10-30 MED FILL — TRINTELLIX 20 MG TABLET: 20 | 30 days supply | Qty: 30 | Fill #0

## 2017-10-30 MED FILL — ALPRAZolam 0.25 MG TABS: 0.25 | 30 days supply | Qty: 30 | Fill #0

## 2017-10-30 MED FILL — ZALEPLON 10 MG CAPSULE: 10 | 30 days supply | Qty: 60 | Fill #0

## 2017-10-31 MED FILL — ATOMOXETINE HCL 80 MG CAPS: 80 | 30 days supply | Qty: 30 | Fill #4

## 2017-11-16 MED FILL — AMPHETAMINE SULFATE 10 MG T: 10 | 30 days supply | Qty: 60 | Fill #0

## 2017-11-16 MED FILL — POLYETHYLENE GLYCOL 3350 PO: 30 days supply | Qty: 527 | Fill #1

## 2017-11-30 MED FILL — TRINTELLIX 20 MG TABLET: 20 | 30 days supply | Qty: 30 | Fill #1

## 2017-11-30 MED FILL — ATOMOXETINE HCL 80 MG CAP: 80 | 30 days supply | Qty: 30 | Fill #5

## 2017-11-30 MED FILL — ZALEPLON 10 MG CAPSULE: 10 | 30 days supply | Qty: 60 | Fill #1

## 2017-12-06 DIAGNOSIS — F902 Attention-deficit hyperactivity disorder, combined type: Secondary | ICD-10-CM | POA: Diagnosis not present

## 2017-12-06 DIAGNOSIS — Z79899 Other long term (current) drug therapy: Secondary | ICD-10-CM | POA: Diagnosis not present

## 2017-12-06 DIAGNOSIS — F419 Anxiety disorder, unspecified: Secondary | ICD-10-CM | POA: Diagnosis not present

## 2017-12-06 DIAGNOSIS — Z72820 Sleep deprivation: Secondary | ICD-10-CM | POA: Diagnosis not present

## 2017-12-06 DIAGNOSIS — F338 Other recurrent depressive disorders: Secondary | ICD-10-CM | POA: Diagnosis not present

## 2017-12-17 MED FILL — AMPHETAMINE SULFATE 10 MG T: 10 | 30 days supply | Qty: 60 | Fill #0

## 2018-01-02 ENCOUNTER — Other Ambulatory Visit: Payer: Self-pay | Admitting: *Deleted

## 2018-01-02 DIAGNOSIS — Z1231 Encounter for screening mammogram for malignant neoplasm of breast: Secondary | ICD-10-CM

## 2018-01-02 MED FILL — TRINTELLIX 20 MG TABLET: 20 | 30 days supply | Qty: 30 | Fill #2

## 2018-01-02 MED FILL — ATOMOXETINE HCL 80 MG CAP: 80 | 30 days supply | Qty: 30 | Fill #6

## 2018-01-02 MED FILL — ZALEPLON 10 MG CAPSULE: 10 | 30 days supply | Qty: 60 | Fill #2

## 2018-01-10 DIAGNOSIS — H1045 Other chronic allergic conjunctivitis: Secondary | ICD-10-CM | POA: Diagnosis not present

## 2018-01-15 MED FILL — AMPHETAMINE SULFATE 10 MG T: 10 | 30 days supply | Qty: 60 | Fill #0

## 2018-01-15 MED FILL — POLYETHYLENE GLYCOL 3350 PO: 30 days supply | Qty: 527 | Fill #0

## 2018-02-05 DIAGNOSIS — J069 Acute upper respiratory infection, unspecified: Secondary | ICD-10-CM | POA: Diagnosis not present

## 2018-02-05 MED FILL — TRINTELLIX 20 MG TABLET: 20 | 30 days supply | Qty: 30 | Fill #1

## 2018-02-05 MED FILL — ATOMOXETINE HCL 80 MG CAP: 80 | 30 days supply | Qty: 30 | Fill #7

## 2018-02-05 MED FILL — ZALEPLON 10 MG CAPSULE: 10 | 30 days supply | Qty: 60 | Fill #0

## 2018-02-14 DIAGNOSIS — Z01419 Encounter for gynecological examination (general) (routine) without abnormal findings: Secondary | ICD-10-CM | POA: Diagnosis not present

## 2018-02-14 DIAGNOSIS — F3341 Major depressive disorder, recurrent, in partial remission: Secondary | ICD-10-CM | POA: Diagnosis not present

## 2018-02-14 DIAGNOSIS — Z0000 Encounter for general adult medical examination without abnormal findings: Secondary | ICD-10-CM | POA: Diagnosis not present

## 2018-02-14 DIAGNOSIS — F338 Other recurrent depressive disorders: Secondary | ICD-10-CM | POA: Diagnosis not present

## 2018-02-14 DIAGNOSIS — N838 Other noninflammatory disorders of ovary, fallopian tube and broad ligament: Secondary | ICD-10-CM | POA: Diagnosis not present

## 2018-02-14 DIAGNOSIS — N949 Unspecified condition associated with female genital organs and menstrual cycle: Secondary | ICD-10-CM | POA: Diagnosis not present

## 2018-02-14 MED FILL — AMPHETAMINE SULFATE 10 MG T: 10 | 30 days supply | Qty: 60 | Fill #0

## 2018-02-20 ENCOUNTER — Ambulatory Visit (INDEPENDENT_AMBULATORY_CARE_PROVIDER_SITE_OTHER): Payer: 59

## 2018-02-20 DIAGNOSIS — Z1231 Encounter for screening mammogram for malignant neoplasm of breast: Secondary | ICD-10-CM

## 2018-02-28 DIAGNOSIS — F902 Attention-deficit hyperactivity disorder, combined type: Secondary | ICD-10-CM | POA: Diagnosis not present

## 2018-02-28 DIAGNOSIS — H93293 Other abnormal auditory perceptions, bilateral: Secondary | ICD-10-CM | POA: Diagnosis not present

## 2018-02-28 DIAGNOSIS — F419 Anxiety disorder, unspecified: Secondary | ICD-10-CM | POA: Diagnosis not present

## 2018-02-28 DIAGNOSIS — Z79899 Other long term (current) drug therapy: Secondary | ICD-10-CM | POA: Diagnosis not present

## 2018-03-04 MED FILL — TRINTELLIX 20 MG TABLET: 20 | 30 days supply | Qty: 30 | Fill #2

## 2018-03-04 MED FILL — ATOMOXETINE HCL 80 MG CAP: 80 | 30 days supply | Qty: 30 | Fill #8

## 2018-03-07 MED FILL — ZALEPLON 10 MG CAPSULE: 10 | 30 days supply | Qty: 60 | Fill #0

## 2018-03-15 MED FILL — AMPHETAMINE SULFATE 10 MG T: 10 | 30 days supply | Qty: 60 | Fill #0

## 2018-03-28 MED FILL — SM CLEARLAX POWDER: 29 days supply | Qty: 510 | Fill #1

## 2018-04-01 MED FILL — TRINTELLIX 20 MG TABLET: 20 | 30 days supply | Qty: 30 | Fill #3

## 2018-04-01 MED FILL — ATOMOXETINE HCL 80 MG CAPS: 80 | 30 days supply | Qty: 30 | Fill #9

## 2018-04-15 MED FILL — AMPHETAMINE SULFATE 10 MG T: 10 | 30 days supply | Qty: 60 | Fill #0

## 2018-04-23 MED FILL — ZALEPLON 10 MG CAPSULE: 10 | 10 days supply | Qty: 20 | Fill #0

## 2018-04-30 MED FILL — ATOMOXETINE HCL 80 MG CAPS: 80 | 30 days supply | Qty: 30 | Fill #10

## 2018-05-02 MED FILL — ZALEPLON 10 MG CAPSULE: 10 | 30 days supply | Qty: 60 | Fill #1

## 2018-05-10 MED FILL — TRINTELLIX 20 MG TABLET: 20 | 30 days supply | Qty: 30 | Fill #0

## 2018-05-14 MED FILL — AMPHETAMINE SULFATE 10 MG T: 10 | 30 days supply | Qty: 60 | Fill #0

## 2018-05-30 MED FILL — ZALEPLON 10 MG CAPSULE: 10 | 30 days supply | Qty: 60 | Fill #2

## 2018-05-30 MED FILL — ATOMOXETINE HCL 80 MG CAPS: 80 | 30 days supply | Qty: 30 | Fill #11

## 2018-06-10 MED FILL — TRINTELLIX 20 MG TABLET: 20 | 30 days supply | Qty: 30 | Fill #1

## 2018-06-14 MED FILL — AMPHETAMINE SULFATE 10 MG T: 10 | 30 days supply | Qty: 60 | Fill #0

## 2018-06-18 DIAGNOSIS — H93293 Other abnormal auditory perceptions, bilateral: Secondary | ICD-10-CM | POA: Diagnosis not present

## 2018-06-18 DIAGNOSIS — Z79899 Other long term (current) drug therapy: Secondary | ICD-10-CM | POA: Diagnosis not present

## 2018-06-18 DIAGNOSIS — F419 Anxiety disorder, unspecified: Secondary | ICD-10-CM | POA: Diagnosis not present

## 2018-06-18 DIAGNOSIS — F902 Attention-deficit hyperactivity disorder, combined type: Secondary | ICD-10-CM | POA: Diagnosis not present

## 2018-06-18 MED FILL — ATOMOXETINE HCL 60 MG CAPS: 60 | 30 days supply | Qty: 30 | Fill #0

## 2018-07-01 MED FILL — ZALEPLON 10 MG CAPSULE: 10 | 30 days supply | Qty: 60 | Fill #3

## 2018-07-12 MED FILL — AMPHETAMINE SULFATE 10 MG T: 10 | 30 days supply | Qty: 60 | Fill #0

## 2018-07-12 MED FILL — ATOMOXETINE HCL 60 MG CAPS: 60 | 30 days supply | Qty: 30 | Fill #1

## 2018-07-22 MED FILL — TRINTELLIX 20 MG TABLET: 20 | 30 days supply | Qty: 30 | Fill #0

## 2018-07-31 MED FILL — ZALEPLON 10 MG CAPSULE: 10 | 20 days supply | Qty: 40 | Fill #4

## 2018-08-13 MED FILL — ATOMOXETINE HCL 60 MG CAP: 60 | 30 days supply | Qty: 30 | Fill #2

## 2018-08-13 MED FILL — AMPHETAMINE SULFATE 10 MG T: 10 | 30 days supply | Qty: 60 | Fill #0

## 2018-08-15 MED FILL — TRINTELLIX 20 MG TABLET: 20 | 30 days supply | Qty: 30 | Fill #1

## 2018-08-22 MED FILL — ZALEPLON 10 MG CAPSULE: 10 | 30 days supply | Qty: 60 | Fill #0

## 2018-09-05 DIAGNOSIS — F5104 Psychophysiologic insomnia: Secondary | ICD-10-CM | POA: Diagnosis not present

## 2018-09-05 DIAGNOSIS — J069 Acute upper respiratory infection, unspecified: Secondary | ICD-10-CM | POA: Diagnosis not present

## 2018-09-05 DIAGNOSIS — F419 Anxiety disorder, unspecified: Secondary | ICD-10-CM | POA: Diagnosis not present

## 2018-09-05 DIAGNOSIS — Z23 Encounter for immunization: Secondary | ICD-10-CM | POA: Diagnosis not present

## 2018-09-05 DIAGNOSIS — F3341 Major depressive disorder, recurrent, in partial remission: Secondary | ICD-10-CM | POA: Diagnosis not present

## 2018-09-05 DIAGNOSIS — F338 Other recurrent depressive disorders: Secondary | ICD-10-CM | POA: Diagnosis not present

## 2018-09-10 MED FILL — ATOMOXETINE HCL 60 MG CAPS: 60 | 30 days supply | Qty: 30 | Fill #0

## 2018-09-10 MED FILL — AMPHETAMINE SULFATE 10 MG T: 10 | 30 days supply | Qty: 60 | Fill #0

## 2018-09-10 MED FILL — TRINTELLIX 20 MG TABLET: 20 | 27 days supply | Qty: 27 | Fill #2

## 2018-09-12 DIAGNOSIS — H10022 Other mucopurulent conjunctivitis, left eye: Secondary | ICD-10-CM | POA: Diagnosis not present

## 2018-09-19 MED FILL — ZALEPLON 10 MG CAPSULE: 10 | 30 days supply | Qty: 60 | Fill #0

## 2018-09-25 DIAGNOSIS — Z79899 Other long term (current) drug therapy: Secondary | ICD-10-CM | POA: Diagnosis not present

## 2018-09-25 DIAGNOSIS — F902 Attention-deficit hyperactivity disorder, combined type: Secondary | ICD-10-CM | POA: Diagnosis not present

## 2018-09-25 DIAGNOSIS — F419 Anxiety disorder, unspecified: Secondary | ICD-10-CM | POA: Diagnosis not present

## 2018-10-08 MED FILL — TRINTELLIX 20 MG TABLET: 20 | 30 days supply | Qty: 30 | Fill #0

## 2018-10-08 MED FILL — AMPHETAMINE SULFATE 10 MG T: 10 | 30 days supply | Qty: 60 | Fill #0

## 2018-10-14 MED FILL — ATOMOXETINE HCL 60 MG CAPS: 60 | 30 days supply | Qty: 30 | Fill #1

## 2018-10-21 MED FILL — ZALEPLON 10 MG CAPSULE: 10 | 30 days supply | Qty: 60 | Fill #1

## 2018-11-07 MED FILL — TRINTELLIX 20 MG TABLET: 20 | 30 days supply | Qty: 30 | Fill #1

## 2018-11-11 MED FILL — AMPHETAMINE SULFATE 10 MG T: 10 | 30 days supply | Qty: 60 | Fill #0

## 2018-11-12 MED FILL — ATOMOXETINE HCL 60 MG CAPS: 60 | 30 days supply | Qty: 30 | Fill #2

## 2018-11-19 MED FILL — ZALEPLON 10 MG CAPSULE: 10 | 30 days supply | Qty: 60 | Fill #2

## 2018-12-05 MED FILL — TRINTELLIX 20 MG TABLET: 20 | 30 days supply | Qty: 30 | Fill #2

## 2018-12-06 MED FILL — ATOMOXETINE HCL 60 MG CAPS: 60 | 30 days supply | Qty: 30 | Fill #3

## 2018-12-09 MED FILL — AMPHETAMINE SULFATE 10 MG T: 10 | 30 days supply | Qty: 60 | Fill #0

## 2018-12-17 MED FILL — ZALEPLON 10 MG CAPS: 10 | 30 days supply | Qty: 60 | Fill #3

## 2019-01-06 MED FILL — AMPHETAMINE SULFATE 10 MG T: 10 | 30 days supply | Qty: 60 | Fill #0

## 2019-01-06 MED FILL — TRINTELLIX 20 MG TABLET: 20 | 30 days supply | Qty: 30 | Fill #0

## 2019-01-09 DIAGNOSIS — H10022 Other mucopurulent conjunctivitis, left eye: Secondary | ICD-10-CM | POA: Diagnosis not present

## 2019-01-14 DIAGNOSIS — K219 Gastro-esophageal reflux disease without esophagitis: Secondary | ICD-10-CM | POA: Diagnosis not present

## 2019-01-14 DIAGNOSIS — F419 Anxiety disorder, unspecified: Secondary | ICD-10-CM | POA: Diagnosis not present

## 2019-01-14 DIAGNOSIS — F439 Reaction to severe stress, unspecified: Secondary | ICD-10-CM | POA: Diagnosis not present

## 2019-01-14 MED FILL — ATOMOXETINE HCL 60 MG CAPS: 60 | 30 days supply | Qty: 30 | Fill #4

## 2019-01-14 MED FILL — ZALEPLON 10 MG CAPS: 10 | 30 days supply | Qty: 60 | Fill #0

## 2019-01-20 DIAGNOSIS — F419 Anxiety disorder, unspecified: Secondary | ICD-10-CM | POA: Diagnosis not present

## 2019-01-20 DIAGNOSIS — F338 Other recurrent depressive disorders: Secondary | ICD-10-CM | POA: Diagnosis not present

## 2019-01-20 DIAGNOSIS — Z79899 Other long term (current) drug therapy: Secondary | ICD-10-CM | POA: Diagnosis not present

## 2019-01-20 DIAGNOSIS — F902 Attention-deficit hyperactivity disorder, combined type: Secondary | ICD-10-CM | POA: Diagnosis not present

## 2019-02-06 MED FILL — TRINTELLIX 20 MG TABLET: 20 | 30 days supply | Qty: 30 | Fill #1

## 2019-02-07 MED FILL — AMPHETAMINE SULFATE 10 MG T: 10 | 30 days supply | Qty: 60 | Fill #0

## 2019-02-12 MED FILL — ATOMOXETINE HCL 60 MG CAPS: 60 | 30 days supply | Qty: 30 | Fill #5

## 2019-02-18 MED FILL — ZALEPLON 10 MG CAPS: 10 | 30 days supply | Qty: 60 | Fill #1

## 2019-03-10 MED FILL — TRINTELLIX 20 MG TABLET: 20 | 30 days supply | Qty: 30 | Fill #2

## 2019-03-10 MED FILL — ATOMOXETINE HCL 60 MG CAPS: 60 | 30 days supply | Qty: 30 | Fill #6

## 2019-03-10 MED FILL — AMPHETAMINE SULFATE 10 MG T: 10 | 30 days supply | Qty: 60 | Fill #0

## 2019-03-20 MED FILL — ZALEPLON 10 MG CAPS: 10 | 30 days supply | Qty: 60 | Fill #0

## 2019-04-08 MED FILL — ATOMOXETINE HCL 60 MG CAPS: 60 | 30 days supply | Qty: 30 | Fill #7

## 2019-04-08 MED FILL — AMPHETAMINE SULFATE 10 MG T: 10 | 30 days supply | Qty: 60 | Fill #0

## 2019-04-08 MED FILL — TRINTELLIX 20 MG TABLET: 20 | 30 days supply | Qty: 30 | Fill #0

## 2019-04-18 MED FILL — ZALEPLON 10 MG CAPS: 10 | 30 days supply | Qty: 60 | Fill #1

## 2019-05-07 MED FILL — TRINTELLIX 20 MG TABLET: 20 | 30 days supply | Qty: 30 | Fill #1

## 2019-05-09 DIAGNOSIS — F902 Attention-deficit hyperactivity disorder, combined type: Secondary | ICD-10-CM | POA: Diagnosis not present

## 2019-05-09 DIAGNOSIS — F338 Other recurrent depressive disorders: Secondary | ICD-10-CM | POA: Diagnosis not present

## 2019-05-09 DIAGNOSIS — F419 Anxiety disorder, unspecified: Secondary | ICD-10-CM | POA: Diagnosis not present

## 2019-05-09 MED FILL — AMPHETAMINE SULFATE 10 MG T: 10 | 30 days supply | Qty: 60 | Fill #0

## 2019-05-14 DIAGNOSIS — F988 Other specified behavioral and emotional disorders with onset usually occurring in childhood and adolescence: Secondary | ICD-10-CM | POA: Diagnosis not present

## 2019-05-14 DIAGNOSIS — F5105 Insomnia due to other mental disorder: Secondary | ICD-10-CM | POA: Diagnosis not present

## 2019-05-14 DIAGNOSIS — F331 Major depressive disorder, recurrent, moderate: Secondary | ICD-10-CM | POA: Diagnosis not present

## 2019-05-14 DIAGNOSIS — F411 Generalized anxiety disorder: Secondary | ICD-10-CM | POA: Diagnosis not present

## 2019-05-16 MED FILL — ATOMOXETINE HCL 60 MG CAPS: 60 | 30 days supply | Qty: 30 | Fill #8

## 2019-05-16 MED FILL — ZALEPLON 10 MG CAPS: 10 | 30 days supply | Qty: 60 | Fill #0

## 2019-05-27 DIAGNOSIS — Z1239 Encounter for other screening for malignant neoplasm of breast: Secondary | ICD-10-CM | POA: Diagnosis not present

## 2019-05-27 DIAGNOSIS — Z0000 Encounter for general adult medical examination without abnormal findings: Secondary | ICD-10-CM | POA: Diagnosis not present

## 2019-05-27 DIAGNOSIS — F339 Major depressive disorder, recurrent, unspecified: Secondary | ICD-10-CM | POA: Diagnosis not present

## 2019-05-27 MED FILL — QUEtiapine FUMARATE ER 50 M: 50 | 30 days supply | Qty: 30 | Fill #0

## 2019-06-04 MED FILL — TRINTELLIX 20 MG TABLET: 20 | 30 days supply | Qty: 30 | Fill #2

## 2019-06-09 MED FILL — AMPHETAMINE SULFATE 10 MG T: 10 | 30 days supply | Qty: 60 | Fill #0

## 2019-06-10 MED FILL — ATOMOXETINE HCL 60 MG CAPS: 60 | 30 days supply | Qty: 30 | Fill #9

## 2019-06-11 DIAGNOSIS — H01022 Squamous blepharitis right lower eyelid: Secondary | ICD-10-CM | POA: Diagnosis not present

## 2019-06-16 MED FILL — ZALEPLON 10 MG CAPS: 10 | 30 days supply | Qty: 60 | Fill #1

## 2019-06-18 DIAGNOSIS — F411 Generalized anxiety disorder: Secondary | ICD-10-CM | POA: Diagnosis not present

## 2019-06-18 DIAGNOSIS — F331 Major depressive disorder, recurrent, moderate: Secondary | ICD-10-CM | POA: Diagnosis not present

## 2019-06-18 DIAGNOSIS — F5105 Insomnia due to other mental disorder: Secondary | ICD-10-CM | POA: Diagnosis not present

## 2019-06-18 DIAGNOSIS — F988 Other specified behavioral and emotional disorders with onset usually occurring in childhood and adolescence: Secondary | ICD-10-CM | POA: Diagnosis not present

## 2019-06-18 MED FILL — QUETIAPINE 25 MG TABLET: 25 | 30 days supply | Qty: 30 | Fill #0

## 2019-06-25 ENCOUNTER — Other Ambulatory Visit: Payer: Self-pay | Admitting: *Deleted

## 2019-06-25 DIAGNOSIS — Z1231 Encounter for screening mammogram for malignant neoplasm of breast: Secondary | ICD-10-CM

## 2019-07-07 MED FILL — TRINTELLIX 20 MG TABLET: 20 | 30 days supply | Qty: 30 | Fill #0

## 2019-07-07 MED FILL — ATOMOXETINE HCL 60 MG CAPS: 60 | 30 days supply | Qty: 30 | Fill #10

## 2019-07-09 MED FILL — AMPHETAMINE SULFATE 10 MG T: 10 | 30 days supply | Qty: 60 | Fill #0

## 2019-07-16 MED FILL — QUETIAPINE 25 MG TABLET: 25 | 30 days supply | Qty: 30 | Fill #1

## 2019-07-25 DIAGNOSIS — Z23 Encounter for immunization: Secondary | ICD-10-CM | POA: Diagnosis not present

## 2019-07-28 DIAGNOSIS — F338 Other recurrent depressive disorders: Secondary | ICD-10-CM | POA: Diagnosis not present

## 2019-07-28 DIAGNOSIS — F902 Attention-deficit hyperactivity disorder, combined type: Secondary | ICD-10-CM | POA: Diagnosis not present

## 2019-07-28 DIAGNOSIS — Z79899 Other long term (current) drug therapy: Secondary | ICD-10-CM | POA: Diagnosis not present

## 2019-07-28 DIAGNOSIS — Z72820 Sleep deprivation: Secondary | ICD-10-CM | POA: Diagnosis not present

## 2019-07-28 DIAGNOSIS — F419 Anxiety disorder, unspecified: Secondary | ICD-10-CM | POA: Diagnosis not present

## 2019-08-04 MED FILL — AMPHETAMINE SULFATE 10 MG T: 10 | 30 days supply | Qty: 60 | Fill #0

## 2019-08-06 ENCOUNTER — Other Ambulatory Visit: Payer: Self-pay

## 2019-08-06 ENCOUNTER — Ambulatory Visit (INDEPENDENT_AMBULATORY_CARE_PROVIDER_SITE_OTHER): Payer: 59

## 2019-08-06 DIAGNOSIS — Z1231 Encounter for screening mammogram for malignant neoplasm of breast: Secondary | ICD-10-CM

## 2019-08-07 MED FILL — ATOMOXETINE HCL 60 MG CAPS: 60 | 30 days supply | Qty: 30 | Fill #11

## 2019-08-07 MED FILL — TRINTELLIX 20 MG TABLET: 20 | 30 days supply | Qty: 30 | Fill #1

## 2019-08-20 MED FILL — QUETIAPINE 25 MG TABLET: 25 | 30 days supply | Qty: 30 | Fill #0

## 2019-09-04 DIAGNOSIS — F411 Generalized anxiety disorder: Secondary | ICD-10-CM | POA: Diagnosis not present

## 2019-09-04 DIAGNOSIS — F988 Other specified behavioral and emotional disorders with onset usually occurring in childhood and adolescence: Secondary | ICD-10-CM | POA: Diagnosis not present

## 2019-09-04 DIAGNOSIS — F331 Major depressive disorder, recurrent, moderate: Secondary | ICD-10-CM | POA: Diagnosis not present

## 2019-09-04 DIAGNOSIS — F5105 Insomnia due to other mental disorder: Secondary | ICD-10-CM | POA: Diagnosis not present

## 2019-09-04 MED FILL — AMPHETAMINE SULFATE 10 MG T: 10 | 30 days supply | Qty: 60 | Fill #0

## 2019-09-04 MED FILL — TRINTELLIX 20 MG TABLET: 20 | 30 days supply | Qty: 30 | Fill #2

## 2019-09-12 MED FILL — ATOMOXETINE HCL 60 MG CAPS: 60 | 30 days supply | Qty: 30 | Fill #0

## 2019-09-15 MED FILL — QUETIAPINE 25 MG TABLET: 25 | 30 days supply | Qty: 60 | Fill #0

## 2019-10-07 MED FILL — TRINTELLIX 20 MG TABLET: 20 | 30 days supply | Qty: 30 | Fill #0

## 2019-10-07 MED FILL — AMPHETAMINE SULFATE 10 MG T: 10 | 30 days supply | Qty: 60 | Fill #0

## 2019-10-08 DIAGNOSIS — F419 Anxiety disorder, unspecified: Secondary | ICD-10-CM | POA: Diagnosis not present

## 2019-10-10 MED FILL — ATOMOXETINE HCL 60 MG CAPS: 60 | 30 days supply | Qty: 30 | Fill #1

## 2019-10-27 DIAGNOSIS — Z72820 Sleep deprivation: Secondary | ICD-10-CM | POA: Diagnosis not present

## 2019-10-27 DIAGNOSIS — F902 Attention-deficit hyperactivity disorder, combined type: Secondary | ICD-10-CM | POA: Diagnosis not present

## 2019-10-27 DIAGNOSIS — F338 Other recurrent depressive disorders: Secondary | ICD-10-CM | POA: Diagnosis not present

## 2019-10-27 DIAGNOSIS — Z79899 Other long term (current) drug therapy: Secondary | ICD-10-CM | POA: Diagnosis not present

## 2019-10-27 DIAGNOSIS — F419 Anxiety disorder, unspecified: Secondary | ICD-10-CM | POA: Diagnosis not present

## 2019-11-05 DIAGNOSIS — F419 Anxiety disorder, unspecified: Secondary | ICD-10-CM | POA: Diagnosis not present

## 2019-11-05 MED FILL — AMPHETAMINE SULFATE 10 MG T: 10 | 30 days supply | Qty: 60 | Fill #0

## 2019-11-05 MED FILL — QUETIAPINE 25 MG TABLET: 25 | 30 days supply | Qty: 60 | Fill #1

## 2019-11-05 MED FILL — TRINTELLIX 20 MG TABLET: 20 | 30 days supply | Qty: 30 | Fill #1

## 2019-11-05 MED FILL — ATOMOXETINE HCL 60 MG CAPS: 60 | 30 days supply | Qty: 30 | Fill #2

## 2019-11-10 DIAGNOSIS — R5383 Other fatigue: Secondary | ICD-10-CM | POA: Diagnosis not present

## 2019-11-10 DIAGNOSIS — J029 Acute pharyngitis, unspecified: Secondary | ICD-10-CM | POA: Diagnosis not present

## 2019-11-10 DIAGNOSIS — R519 Headache, unspecified: Secondary | ICD-10-CM | POA: Diagnosis not present

## 2019-11-10 DIAGNOSIS — Z20828 Contact with and (suspected) exposure to other viral communicable diseases: Secondary | ICD-10-CM | POA: Diagnosis not present

## 2019-11-12 DIAGNOSIS — F988 Other specified behavioral and emotional disorders with onset usually occurring in childhood and adolescence: Secondary | ICD-10-CM | POA: Diagnosis not present

## 2019-11-12 DIAGNOSIS — F5105 Insomnia due to other mental disorder: Secondary | ICD-10-CM | POA: Diagnosis not present

## 2019-11-12 DIAGNOSIS — F411 Generalized anxiety disorder: Secondary | ICD-10-CM | POA: Diagnosis not present

## 2019-11-12 DIAGNOSIS — F331 Major depressive disorder, recurrent, moderate: Secondary | ICD-10-CM | POA: Diagnosis not present

## 2019-11-26 MED FILL — DOXEPIN HCL 6 MG TABS: 6 | 30 days supply | Qty: 30 | Fill #0

## 2019-12-04 MED FILL — TRINTELLIX 20 MG TABLET: 20 | 30 days supply | Qty: 30 | Fill #0

## 2019-12-04 MED FILL — AMPHETAMINE SULFATE 10 MG T: 10 | 30 days supply | Qty: 60 | Fill #0

## 2019-12-12 MED FILL — ATOMOXETINE HCL 60 MG CAPS: 60 | 30 days supply | Qty: 30 | Fill #3

## 2020-01-05 MED FILL — TRINTELLIX 20 MG TABLET: 20 | 30 days supply | Qty: 30 | Fill #1

## 2020-01-05 MED FILL — AMPHETAMINE SULFATE 10 MG T: 10 | 30 days supply | Qty: 60 | Fill #0

## 2020-01-05 MED FILL — ATOMOXETINE HCL 60 MG CAPS: 60 | 30 days supply | Qty: 30 | Fill #4

## 2020-01-19 DIAGNOSIS — Z20822 Contact with and (suspected) exposure to covid-19: Secondary | ICD-10-CM | POA: Diagnosis not present

## 2020-01-19 DIAGNOSIS — R509 Fever, unspecified: Secondary | ICD-10-CM | POA: Diagnosis not present

## 2020-01-25 DIAGNOSIS — R519 Headache, unspecified: Secondary | ICD-10-CM | POA: Diagnosis not present

## 2020-01-25 DIAGNOSIS — R0981 Nasal congestion: Secondary | ICD-10-CM | POA: Diagnosis not present

## 2020-01-25 DIAGNOSIS — U071 COVID-19: Secondary | ICD-10-CM | POA: Diagnosis not present

## 2020-01-25 DIAGNOSIS — R438 Other disturbances of smell and taste: Secondary | ICD-10-CM | POA: Diagnosis not present

## 2020-01-26 MED FILL — DOXEPIN HCL 6 MG TABS: 6 | 30 days supply | Qty: 30 | Fill #1

## 2020-01-27 DIAGNOSIS — L719 Rosacea, unspecified: Secondary | ICD-10-CM | POA: Diagnosis not present

## 2020-01-27 DIAGNOSIS — R21 Rash and other nonspecific skin eruption: Secondary | ICD-10-CM | POA: Diagnosis not present

## 2020-01-27 DIAGNOSIS — U071 COVID-19: Secondary | ICD-10-CM | POA: Diagnosis not present

## 2020-01-27 MED FILL — SOD SULFACETAMIDE-SULFUR LO: 10-5 | 14 days supply | Qty: 25 | Fill #0

## 2020-01-27 MED FILL — HYDROCORTISONE 2.5% CREAM: 2.5 | 15 days supply | Qty: 30 | Fill #0

## 2020-02-02 DIAGNOSIS — Z79899 Other long term (current) drug therapy: Secondary | ICD-10-CM | POA: Diagnosis not present

## 2020-02-02 DIAGNOSIS — F419 Anxiety disorder, unspecified: Secondary | ICD-10-CM | POA: Diagnosis not present

## 2020-02-02 DIAGNOSIS — F338 Other recurrent depressive disorders: Secondary | ICD-10-CM | POA: Diagnosis not present

## 2020-02-02 DIAGNOSIS — F902 Attention-deficit hyperactivity disorder, combined type: Secondary | ICD-10-CM | POA: Diagnosis not present

## 2020-02-02 DIAGNOSIS — Z72820 Sleep deprivation: Secondary | ICD-10-CM | POA: Diagnosis not present

## 2020-02-02 MED FILL — ATOMOXETINE HCL 60 MG CAPS: 60 | 30 days supply | Qty: 30 | Fill #5

## 2020-02-02 MED FILL — AMPHETAMINE SULFATE 10 MG T: 10 | 30 days supply | Qty: 60 | Fill #0

## 2020-02-02 MED FILL — TRINTELLIX 20 MG TABLET: 20 | 30 days supply | Qty: 30 | Fill #2

## 2020-02-20 DIAGNOSIS — F411 Generalized anxiety disorder: Secondary | ICD-10-CM | POA: Diagnosis not present

## 2020-02-20 DIAGNOSIS — F331 Major depressive disorder, recurrent, moderate: Secondary | ICD-10-CM | POA: Diagnosis not present

## 2020-02-20 DIAGNOSIS — F988 Other specified behavioral and emotional disorders with onset usually occurring in childhood and adolescence: Secondary | ICD-10-CM | POA: Diagnosis not present

## 2020-02-20 DIAGNOSIS — F5105 Insomnia due to other mental disorder: Secondary | ICD-10-CM | POA: Diagnosis not present

## 2020-02-20 MED FILL — DOXEPIN HCL 6 MG TABS: 6 | 30 days supply | Qty: 30 | Fill #0

## 2020-03-01 MED FILL — DOXEPIN HCL 6 MG TABS: 6 | 30 days supply | Qty: 30 | Fill #0

## 2020-03-01 MED FILL — TRINTELLIX 20 MG TABLET: 20 | 30 days supply | Qty: 30 | Fill #0

## 2020-03-01 MED FILL — ATOMOXETINE HCL 60 MG CAPS: 60 | 30 days supply | Qty: 30 | Fill #6

## 2020-03-02 MED FILL — AMPHETAMINE SULFATE 10 MG T: 10 | 30 days supply | Qty: 60 | Fill #0

## 2020-03-30 DIAGNOSIS — F419 Anxiety disorder, unspecified: Secondary | ICD-10-CM | POA: Diagnosis not present

## 2020-04-05 MED FILL — ATOMOXETINE HCL 60 MG CAPS: 60 | 30 days supply | Qty: 30 | Fill #7

## 2020-04-05 MED FILL — AMPHETAMINE SULFATE 10 MG T: 10 | 30 days supply | Qty: 60 | Fill #0

## 2020-04-05 MED FILL — DOXEPIN HCL 6 MG TABS: 6 | 30 days supply | Qty: 30 | Fill #1

## 2020-04-05 MED FILL — TRINTELLIX 20 MG TABLET: 20 | 30 days supply | Qty: 30 | Fill #1

## 2020-04-27 DIAGNOSIS — F419 Anxiety disorder, unspecified: Secondary | ICD-10-CM | POA: Diagnosis not present

## 2020-04-28 DIAGNOSIS — F902 Attention-deficit hyperactivity disorder, combined type: Secondary | ICD-10-CM | POA: Diagnosis not present

## 2020-04-28 DIAGNOSIS — Z79899 Other long term (current) drug therapy: Secondary | ICD-10-CM | POA: Diagnosis not present

## 2020-04-28 DIAGNOSIS — F419 Anxiety disorder, unspecified: Secondary | ICD-10-CM | POA: Diagnosis not present

## 2020-04-28 DIAGNOSIS — F338 Other recurrent depressive disorders: Secondary | ICD-10-CM | POA: Diagnosis not present

## 2020-06-08 DIAGNOSIS — F419 Anxiety disorder, unspecified: Secondary | ICD-10-CM | POA: Diagnosis not present

## 2020-06-11 ENCOUNTER — Other Ambulatory Visit (HOSPITAL_BASED_OUTPATIENT_CLINIC_OR_DEPARTMENT_OTHER): Payer: Self-pay | Admitting: Psychiatry

## 2020-06-11 DIAGNOSIS — F411 Generalized anxiety disorder: Secondary | ICD-10-CM | POA: Diagnosis not present

## 2020-06-11 DIAGNOSIS — F5105 Insomnia due to other mental disorder: Secondary | ICD-10-CM | POA: Diagnosis not present

## 2020-06-11 DIAGNOSIS — F330 Major depressive disorder, recurrent, mild: Secondary | ICD-10-CM | POA: Diagnosis not present

## 2020-07-02 MED FILL — ATOMOXETINE HCL 60 MG CAPS: 60 | 30 days supply | Qty: 30 | Fill #10

## 2020-07-02 MED FILL — DOXEPIN HCL 6 MG TABS: 6 | 30 days supply | Qty: 30 | Fill #0

## 2020-07-02 MED FILL — TRINTELLIX 20 MG TABLET: 20 | 30 days supply | Qty: 30 | Fill #1

## 2020-07-06 DIAGNOSIS — F419 Anxiety disorder, unspecified: Secondary | ICD-10-CM | POA: Diagnosis not present

## 2020-07-07 MED FILL — AMPHETAMINE SULFATE 10 MG T: 10 | 30 days supply | Qty: 60 | Fill #0

## 2020-07-29 DIAGNOSIS — F902 Attention-deficit hyperactivity disorder, combined type: Secondary | ICD-10-CM | POA: Diagnosis not present

## 2020-07-29 DIAGNOSIS — Z79899 Other long term (current) drug therapy: Secondary | ICD-10-CM | POA: Diagnosis not present

## 2020-07-29 DIAGNOSIS — F419 Anxiety disorder, unspecified: Secondary | ICD-10-CM | POA: Diagnosis not present

## 2020-07-29 DIAGNOSIS — F338 Other recurrent depressive disorders: Secondary | ICD-10-CM | POA: Diagnosis not present

## 2020-08-02 MED FILL — ATOMOXETINE HCL 60 MG CAPS: 60 | 30 days supply | Qty: 30 | Fill #11

## 2020-08-02 MED FILL — TRINTELLIX 20 MG TABLET: 20 | 30 days supply | Qty: 30 | Fill #2

## 2020-08-02 MED FILL — DOXEPIN HCL 6 MG TABS: 6 | 30 days supply | Qty: 30 | Fill #1

## 2020-08-04 MED FILL — AMPHETAMINE SULFATE 10 MG T: 10 | 30 days supply | Qty: 60 | Fill #0

## 2020-08-09 DIAGNOSIS — F419 Anxiety disorder, unspecified: Secondary | ICD-10-CM | POA: Diagnosis not present

## 2020-08-25 DIAGNOSIS — F419 Anxiety disorder, unspecified: Secondary | ICD-10-CM | POA: Diagnosis not present

## 2020-08-27 ENCOUNTER — Other Ambulatory Visit: Payer: Self-pay | Admitting: *Deleted

## 2020-08-27 DIAGNOSIS — Z1231 Encounter for screening mammogram for malignant neoplasm of breast: Secondary | ICD-10-CM

## 2020-09-01 MED FILL — TRINTELLIX 20 MG TABLET: 20 | 30 days supply | Qty: 30 | Fill #0

## 2020-09-01 MED FILL — DOXEPIN HCL 6 MG TABS: 6 | 30 days supply | Qty: 30 | Fill #2

## 2020-09-02 ENCOUNTER — Other Ambulatory Visit (HOSPITAL_BASED_OUTPATIENT_CLINIC_OR_DEPARTMENT_OTHER): Payer: Self-pay | Admitting: Physician Assistant

## 2020-09-02 MED FILL — ATOMOXETINE HCL 60 MG CAPS: 60 | 30 days supply | Qty: 30 | Fill #0

## 2020-09-03 MED FILL — AMPHETAMINE SULFATE 10 MG T: 10 | 30 days supply | Qty: 60 | Fill #0

## 2020-09-08 ENCOUNTER — Ambulatory Visit (INDEPENDENT_AMBULATORY_CARE_PROVIDER_SITE_OTHER): Payer: 59

## 2020-09-08 ENCOUNTER — Other Ambulatory Visit: Payer: Self-pay

## 2020-09-08 DIAGNOSIS — Z1231 Encounter for screening mammogram for malignant neoplasm of breast: Secondary | ICD-10-CM

## 2020-09-09 ENCOUNTER — Other Ambulatory Visit (HOSPITAL_BASED_OUTPATIENT_CLINIC_OR_DEPARTMENT_OTHER): Payer: Self-pay | Admitting: Psychiatry

## 2020-09-09 DIAGNOSIS — F411 Generalized anxiety disorder: Secondary | ICD-10-CM | POA: Diagnosis not present

## 2020-09-09 DIAGNOSIS — F330 Major depressive disorder, recurrent, mild: Secondary | ICD-10-CM | POA: Diagnosis not present

## 2020-09-09 DIAGNOSIS — F5105 Insomnia due to other mental disorder: Secondary | ICD-10-CM | POA: Diagnosis not present

## 2020-09-30 MED FILL — TRINTELLIX 20 MG TABLET: 20 | 30 days supply | Qty: 30 | Fill #1

## 2020-09-30 MED FILL — ATOMOXETINE HCL 60 MG CAPS: 60 | 30 days supply | Qty: 30 | Fill #1

## 2020-09-30 MED FILL — DOXEPIN HCL 6 MG TABS: 6 | 30 days supply | Qty: 30 | Fill #0

## 2020-10-01 ENCOUNTER — Other Ambulatory Visit (HOSPITAL_BASED_OUTPATIENT_CLINIC_OR_DEPARTMENT_OTHER): Payer: Self-pay | Admitting: Physician Assistant

## 2020-10-01 MED FILL — AMPHETAMINE SULFATE 10 MG T: 10 | 30 days supply | Qty: 60 | Fill #0

## 2020-10-28 ENCOUNTER — Other Ambulatory Visit (HOSPITAL_BASED_OUTPATIENT_CLINIC_OR_DEPARTMENT_OTHER): Payer: Self-pay | Admitting: Internal Medicine

## 2020-10-28 DIAGNOSIS — F902 Attention-deficit hyperactivity disorder, combined type: Secondary | ICD-10-CM | POA: Diagnosis not present

## 2020-10-28 DIAGNOSIS — Z79899 Other long term (current) drug therapy: Secondary | ICD-10-CM | POA: Diagnosis not present

## 2020-10-28 DIAGNOSIS — F419 Anxiety disorder, unspecified: Secondary | ICD-10-CM | POA: Diagnosis not present

## 2020-10-28 DIAGNOSIS — F338 Other recurrent depressive disorders: Secondary | ICD-10-CM | POA: Diagnosis not present

## 2020-10-29 MED FILL — AMPHETAMINE SULFATE 10 MG T: 10 | 30 days supply | Qty: 60 | Fill #0

## 2020-11-01 MED FILL — TRINTELLIX 20 MG TABLET: 20 | 30 days supply | Qty: 30 | Fill #2

## 2020-11-01 MED FILL — ATOMOXETINE HCL 60 MG CAPS: 60 | 30 days supply | Qty: 30 | Fill #2

## 2020-11-01 MED FILL — DOXEPIN HCL 6 MG TABS: 6 | 30 days supply | Qty: 30 | Fill #1

## 2020-11-11 ENCOUNTER — Other Ambulatory Visit (HOSPITAL_BASED_OUTPATIENT_CLINIC_OR_DEPARTMENT_OTHER): Payer: Self-pay | Admitting: Psychiatry

## 2020-11-11 DIAGNOSIS — F411 Generalized anxiety disorder: Secondary | ICD-10-CM | POA: Diagnosis not present

## 2020-11-11 DIAGNOSIS — F5105 Insomnia due to other mental disorder: Secondary | ICD-10-CM | POA: Diagnosis not present

## 2020-11-11 DIAGNOSIS — F330 Major depressive disorder, recurrent, mild: Secondary | ICD-10-CM | POA: Diagnosis not present

## 2020-11-22 ENCOUNTER — Other Ambulatory Visit (HOSPITAL_BASED_OUTPATIENT_CLINIC_OR_DEPARTMENT_OTHER): Payer: Self-pay | Admitting: Dermatology

## 2020-11-22 DIAGNOSIS — L218 Other seborrheic dermatitis: Secondary | ICD-10-CM | POA: Diagnosis not present

## 2020-11-22 DIAGNOSIS — D235 Other benign neoplasm of skin of trunk: Secondary | ICD-10-CM | POA: Diagnosis not present

## 2020-11-22 DIAGNOSIS — L814 Other melanin hyperpigmentation: Secondary | ICD-10-CM | POA: Diagnosis not present

## 2020-11-22 DIAGNOSIS — X32XXXS Exposure to sunlight, sequela: Secondary | ICD-10-CM | POA: Diagnosis not present

## 2020-11-22 DIAGNOSIS — L400 Psoriasis vulgaris: Secondary | ICD-10-CM | POA: Diagnosis not present

## 2020-11-22 DIAGNOSIS — D2330 Other benign neoplasm of skin of unspecified part of face: Secondary | ICD-10-CM | POA: Diagnosis not present

## 2020-11-22 DIAGNOSIS — D1801 Hemangioma of skin and subcutaneous tissue: Secondary | ICD-10-CM | POA: Diagnosis not present

## 2020-11-22 MED FILL — CLOBETASOL PROPIONATE 0.05: 0.05 | 30 days supply | Qty: 60 | Fill #0

## 2020-11-22 MED FILL — KETOCONAZOLE 2 % CREA: 2 | 15 days supply | Qty: 30 | Fill #0

## 2020-11-25 MED FILL — DOXEPIN HCL 6 MG TABS: 6 | 30 days supply | Qty: 30 | Fill #0

## 2020-11-25 MED FILL — TRINTELLIX 20 MG TABLET: 20 | 30 days supply | Qty: 30 | Fill #0

## 2020-11-26 ENCOUNTER — Ambulatory Visit: Payer: 59

## 2020-11-26 MED FILL — AMPHETAMINE SULFATE 10 MG T: 10 | 30 days supply | Qty: 60 | Fill #0

## 2020-11-26 MED FILL — ATOMOXETINE HCL 60 MG CAPS: 60 | 30 days supply | Qty: 30 | Fill #3

## 2020-12-29 ENCOUNTER — Other Ambulatory Visit (HOSPITAL_BASED_OUTPATIENT_CLINIC_OR_DEPARTMENT_OTHER): Payer: Self-pay | Admitting: Internal Medicine

## 2020-12-29 MED FILL — ATOMOXETINE HCL 60 MG CAPS: 60 | 30 days supply | Qty: 30 | Fill #4

## 2020-12-29 MED FILL — DOXEPIN HCL 6 MG TABS: 6 | 30 days supply | Qty: 30 | Fill #1

## 2020-12-29 MED FILL — AMPHETAMINE SULFATE 10 MG T: 10 | 30 days supply | Qty: 60 | Fill #0

## 2020-12-29 MED FILL — TRINTELLIX 20 MG TABLET: 20 | 30 days supply | Qty: 30 | Fill #1

## 2021-01-20 ENCOUNTER — Other Ambulatory Visit (HOSPITAL_BASED_OUTPATIENT_CLINIC_OR_DEPARTMENT_OTHER): Payer: Self-pay | Admitting: Psychiatry

## 2021-01-20 DIAGNOSIS — F331 Major depressive disorder, recurrent, moderate: Secondary | ICD-10-CM | POA: Diagnosis not present

## 2021-01-20 DIAGNOSIS — F988 Other specified behavioral and emotional disorders with onset usually occurring in childhood and adolescence: Secondary | ICD-10-CM | POA: Diagnosis not present

## 2021-01-20 DIAGNOSIS — F411 Generalized anxiety disorder: Secondary | ICD-10-CM | POA: Diagnosis not present

## 2021-01-20 DIAGNOSIS — F5105 Insomnia due to other mental disorder: Secondary | ICD-10-CM | POA: Diagnosis not present

## 2021-01-24 MED FILL — DOXEPIN HCL 6 MG TABS: 6 | 30 days supply | Qty: 30 | Fill #0

## 2021-01-24 MED FILL — TRINTELLIX 20 MG TABLET: 20 | 30 days supply | Qty: 30 | Fill #0

## 2021-01-26 MED FILL — ATOMOXETINE HCL 60 MG CAPS: 60 | 30 days supply | Qty: 30 | Fill #5

## 2021-01-27 ENCOUNTER — Other Ambulatory Visit (HOSPITAL_BASED_OUTPATIENT_CLINIC_OR_DEPARTMENT_OTHER): Payer: Self-pay | Admitting: Internal Medicine

## 2021-01-27 DIAGNOSIS — F419 Anxiety disorder, unspecified: Secondary | ICD-10-CM | POA: Diagnosis not present

## 2021-01-27 DIAGNOSIS — Z79899 Other long term (current) drug therapy: Secondary | ICD-10-CM | POA: Diagnosis not present

## 2021-01-27 DIAGNOSIS — F902 Attention-deficit hyperactivity disorder, combined type: Secondary | ICD-10-CM | POA: Diagnosis not present

## 2021-01-27 DIAGNOSIS — F338 Other recurrent depressive disorders: Secondary | ICD-10-CM | POA: Diagnosis not present

## 2021-01-27 MED FILL — AMPHETAMINE SULFATE 10 MG T: 10 | 30 days supply | Qty: 60 | Fill #0

## 2021-03-01 MED FILL — ATOMOXETINE HCL 60 MG CAPS: 60 | 30 days supply | Qty: 30 | Fill #6

## 2021-03-01 MED FILL — TRINTELLIX 20 MG TABLET: 20 | 30 days supply | Qty: 30 | Fill #1

## 2021-03-01 MED FILL — DOXEPIN HCL 6 MG TABS: 6 | 30 days supply | Qty: 30 | Fill #1

## 2021-03-02 ENCOUNTER — Other Ambulatory Visit (HOSPITAL_BASED_OUTPATIENT_CLINIC_OR_DEPARTMENT_OTHER): Payer: Self-pay | Admitting: Internal Medicine

## 2021-03-02 MED FILL — AMPHETAMINE SULFATE 10 MG T: 10 | 30 days supply | Qty: 60 | Fill #0

## 2021-03-24 ENCOUNTER — Other Ambulatory Visit (HOSPITAL_BASED_OUTPATIENT_CLINIC_OR_DEPARTMENT_OTHER): Payer: Self-pay | Admitting: Psychiatry

## 2021-03-24 DIAGNOSIS — F330 Major depressive disorder, recurrent, mild: Secondary | ICD-10-CM | POA: Diagnosis not present

## 2021-03-24 DIAGNOSIS — F411 Generalized anxiety disorder: Secondary | ICD-10-CM | POA: Diagnosis not present

## 2021-03-24 DIAGNOSIS — F988 Other specified behavioral and emotional disorders with onset usually occurring in childhood and adolescence: Secondary | ICD-10-CM | POA: Diagnosis not present

## 2021-03-24 DIAGNOSIS — F5105 Insomnia due to other mental disorder: Secondary | ICD-10-CM | POA: Diagnosis not present

## 2021-03-31 ENCOUNTER — Other Ambulatory Visit (HOSPITAL_BASED_OUTPATIENT_CLINIC_OR_DEPARTMENT_OTHER): Payer: Self-pay

## 2021-03-31 MED FILL — Vortioxetine HBr Tab 20 MG (Base Equiv): ORAL | 30 days supply | Qty: 30 | Fill #0 | Status: AC

## 2021-03-31 MED FILL — Atomoxetine HCl Cap 60 MG (Base Equiv): ORAL | 30 days supply | Qty: 30 | Fill #0 | Status: AC

## 2021-03-31 MED FILL — Doxepin HCl (Sleep) Tab 6 MG (Base Equiv): ORAL | 30 days supply | Qty: 30 | Fill #0 | Status: AC

## 2021-04-01 ENCOUNTER — Other Ambulatory Visit (HOSPITAL_BASED_OUTPATIENT_CLINIC_OR_DEPARTMENT_OTHER): Payer: Self-pay

## 2021-04-01 ENCOUNTER — Other Ambulatory Visit: Payer: Self-pay | Admitting: Internal Medicine

## 2021-04-04 ENCOUNTER — Other Ambulatory Visit (HOSPITAL_BASED_OUTPATIENT_CLINIC_OR_DEPARTMENT_OTHER): Payer: Self-pay

## 2021-04-04 MED ORDER — AMPHETAMINE SULFATE 10 MG PO TABS
ORAL_TABLET | ORAL | 0 refills | Status: DC
  Filled 2021-04-04: qty 60, 30d supply, fill #0

## 2021-04-21 ENCOUNTER — Other Ambulatory Visit (HOSPITAL_BASED_OUTPATIENT_CLINIC_OR_DEPARTMENT_OTHER): Payer: Self-pay

## 2021-04-26 ENCOUNTER — Other Ambulatory Visit: Payer: Self-pay

## 2021-04-26 ENCOUNTER — Emergency Department (INDEPENDENT_AMBULATORY_CARE_PROVIDER_SITE_OTHER)
Admission: RE | Admit: 2021-04-26 | Discharge: 2021-04-26 | Disposition: A | Discharge status: Home / Self Care | Payer: 59 | Source: Ambulatory Visit | Attending: Family Medicine | Admitting: Family Medicine

## 2021-04-26 VITALS — BP 91/55 | HR 85 | Temp 98.3°F | Resp 18

## 2021-04-26 DIAGNOSIS — J069 Acute upper respiratory infection, unspecified: Secondary | ICD-10-CM

## 2021-04-26 NOTE — ED Triage Notes (Signed)
Pt c/o sore throat, headache, earache and cough that started Thursday. Sore throat somewhat resolved unless has coughing spells. Low grade fever yesterday.  Motrin prn.

## 2021-04-26 NOTE — ED Provider Notes (Signed)
[facility]    CSN: [id number] Arrival date & time: 04/26/21  1000      History   Chief Complaint Chief Complaint  Patient presents with  . Headache  . Otalgia  . Cough    HPI Beth Hernandez is a 50 y.o. female.   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  She has respiratory symptoms for 5 days now.  Started off with some sore throat that is getting better.  But she has coughing spells, low-grade fever, sinus pressure and pain, and fatigue.  He has been using over-the-counter medicines infrequently.  Motrin has helped with the pain.  Past Medical History:  Diagnosis Date  . Anal fissure 2010  . Anxiety   . Cystitis, interstitial   . Depression   . IBS (irritable bowel syndrome)   . Insomnia   . Internal hemorrhoids   . Perimenopause     Patient Active Problem List   Diagnosis Date Noted  . Depression 10/27/2015  . Irritable bowel syndrome with constipation 10/27/2015  . Interstitial cystitis 10/27/2015  . Insomnia 10/27/2015  . Perimenopause 10/27/2015  . CAP (community acquired pneumonia) 10/27/2015    Past Surgical History:  Procedure Laterality Date  . BREAST BIOPSY Right    2016  . DENTAL SURGERY     skin grafts to gums   . DILATION AND CURETTAGE OF UTERUS    . FISSURECTOMY     with sphincterotomy  . SPHINCTEROTOMY      OB History   No obstetric history on file.      Home Medications    Prior to Admission medications   Medication Sig Start Date End Date Taking? Authorizing Provider  Acetylcysteine 600 MG CAPS Take by mouth.    [provider]  ALPRAZolam Duanne Moron) 0.25 MG tablet Take 0.25 mg by mouth.    [provider]  Ascorbic Acid (VITAMIN C) 1000 MG tablet Take 1,000 mg by mouth daily.     [provider]  atomoxetine (STRATTERA) 60 MG capsule TAKE 1 CAPSULE BY MOUTH ONCE DAILY 09/02/20 09/02/21  Hepler, Elta Guadeloupe, PA-C  Calcium Glycerophosphate (PRELIEF) 340  (65-50) MG (CA-P) TABS Take 4 tablets by mouth 2 (two) times daily before a meal.     [provider]  Cholecalciferol (VITAMIN D) 400 UNITS capsule Take 5,000 Int'l Units by mouth daily.     [provider]  clobetasol cream (TEMOVATE) 0.05 % APPLY TO THE AFFECTED AREAS OF PSORIASIS 2 TIMES DAILY FOR 2 WEEKS, THEN TAKE 1 WEEK OFF AND REPEAT AS NEEDED 11/22/20 11/22/21  Ripley Fraise, MD  docusate sodium (COLACE) 100 MG capsule Take 100 mg by mouth daily.     [provider]  Doxepin HCl 6 MG TABS TAKE 6 MG TO 9 MG AT BED TIME AS NEEDED FOR SLEEP 03/24/21 03/24/22  Atha Starks, MD  hyoscyamine (LEVSIN SL) 0.125 MG SL tablet Take 0.125 mg by mouth every 4 (four) hours as needed.     [provider]  ketoconazole (NIZORAL) 2 % cream APPLY 1 APPLICATION ON TO THE EARS 2 TIMES DAILY 11/22/20 11/22/21  Ripley Fraise, MD  Magnesium Oxide 400 MG CAPS Take 1 tablet by mouth daily.     [provider]  Omega-3 Fatty Acids (FISH OIL) 500 MG CAPS Take 1,000 mg by mouth daily.     [provider]  polyethylene glycol (MIRALAX / GLYCOLAX) packet  Take 17 g by mouth daily.  04/06/14   [provider]  Probiotic Product (ACIDOPHILUS) 90-25 MG CHEW Chew 1 capsule by mouth daily.     [provider]  Sulfacetamide Sodium-Sulfur 10-5 % LOTN Apply daily to face 02/03/14   [provider]  vortioxetine HBr (TRINTELLIX) 20 MG TABS tablet TAKE ONE TABLET (20 MG DOSE) BY MOUTH DAILY. 01/20/21 01/20/22  Atha Starks, MD  zaleplon (SONATA) 10 MG capsule Take 20 mg by mouth at bedtime.   04/26/21  [provider]    Family History Family History  Problem Relation Age of Onset  . Alcohol abuse Father   . Irritable bowel syndrome Father   . Other Father        iliac disease  . Colon polyps Mother   . Colon cancer Maternal Grandmother   . Breast cancer Paternal Grandmother   . Breast cancer Sister   . Breast cancer Paternal Aunt         had Br ca x2  . Esophageal cancer Maternal Grandfather   . Lung cancer Maternal Aunt   . Diabetes Maternal Aunt   . Rectal cancer Neg Hx   . Stomach cancer Neg Hx     Social History Social History   Tobacco Use  . Smoking status: Never Smoker  . Smokeless tobacco: Never Used  Vaping Use  . Vaping Use: Never used  Substance Use Topics  . Alcohol use: No    Alcohol/week: 0.0 standard drinks  . Drug use: No     Allergies   Abilify [aripiprazole], Amphetamines, Atrovent nasal spray [ipratropium], Bacopa monnieri, Bupropion, Cephalexin, Ciprofloxacin, Metadate er [methylphenidate], Penicillins, and Macrobid  [nitrofurantoin monohyd macro]   Review of Systems Review of Systems See HPI  Physical Exam Triage Vital Signs ED Triage Vitals  Enc Vitals Group     BP 04/26/21 1018 (!) 91/55     Pulse Rate 04/26/21 1018 85     Resp 04/26/21 1018 18     Temp 04/26/21 1018 98.3 F (36.8 C)     Temp Source 04/26/21 1018 Oral     SpO2 04/26/21 1018 98 %     Weight --      Height --      Head Circumference --      Peak Flow --      Pain Score 04/26/21 1019 3     Pain Loc --      Pain Edu? --      Excl. in [place]? --    No data found.  Updated Vital Signs BP (!) 91/55 (BP Location: Left Arm)   Pulse 85   Temp 98.3 F (36.8 C) (Oral)   Resp 18   LMP 09/19/2014 Comment: irregular  SpO2 98%     Physical Exam Constitutional:      General: She is not in acute distress.    Appearance: Normal appearance. She is well-developed and normal weight.  HENT:     Head: Normocephalic and atraumatic.     Right Ear: Tympanic membrane, ear canal and external ear normal.     Left Ear: Tympanic membrane, ear canal and external ear normal.     Nose: Congestion present.     Mouth/Throat:     Pharynx: Posterior oropharyngeal erythema present.  Eyes:     Conjunctiva/sclera: Conjunctivae normal.     Pupils: Pupils are equal, round, and reactive to light.  Cardiovascular:     Rate and  Rhythm: Normal rate and regular  rhythm.     Heart sounds: Normal heart sounds.  Pulmonary:     Effort: Pulmonary effort is normal. No respiratory distress.     Breath sounds: Normal breath sounds.  Abdominal:     Palpations: Abdomen is soft.  Musculoskeletal:        General: Normal range of motion.     Cervical back: Normal range of motion.  Lymphadenopathy:     Cervical: Cervical adenopathy present.  Skin:    General: Skin is warm and dry.  Neurological:     Mental Status: She is alert.  Psychiatric:        Behavior: Behavior normal.      UC Treatments / Results  Labs (all labs ordered are listed, but only abnormal results are displayed) Labs Reviewed  COVID-19, FLU A+B AND RSV  COVID-19, FLU A+B AND RSV    EKG   Radiology No results found.  Procedures Procedures (including critical care time)  Medications Ordered in UC Medications - No data to display  Initial Impression / Assessment and Plan / UC Course  I have reviewed the triage vital signs and the nursing notes.  Pertinent labs & imaging results that were available during my care of the patient were reviewed by me and considered in my medical decision making (see chart for details).     Likely viral infection.  Supportive care is recommended.  Testing is performed. Final Clinical Impressions(s) / UC Diagnoses   Final diagnoses:  Viral upper respiratory tract infection     Discharge Instructions     Drink lots of fluids Rest Continue with Mucinex DM and over-the-counter medicines as needed. Test results will be available through MyChart  Consider primary care at [facility], number attached   ED Prescriptions    None     PDMP not reviewed this encounter.   Raylene Everts, MD 04/26/21 1140

## 2021-04-26 NOTE — Discharge Instructions (Signed)
Drink lots of fluids Rest Continue with Mucinex DM and over-the-counter medicines as needed. Test results will be available through MyChart  Consider primary care at [facility], number attached

## 2021-04-27 ENCOUNTER — Telehealth: Payer: Self-pay | Admitting: Emergency Medicine

## 2021-04-27 NOTE — Telephone Encounter (Signed)
Patient called to check the status of her COVID/FLU/RSV test which is still showing pending status on mychart.  Patient was seen by Dr Meda Coffee on 04/26/2021 and was only given a note for today.  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  Advised patient that it can take a couple of days to receive results from lab.  Patient will check later today for results and if results are not back, patient will request a longer note.  Patient voices understanding.

## 2021-04-28 ENCOUNTER — Other Ambulatory Visit (HOSPITAL_BASED_OUTPATIENT_CLINIC_OR_DEPARTMENT_OTHER): Payer: Self-pay

## 2021-04-28 LAB — COVID-19, FLU A+B AND RSV
Influenza A, NAA: NOT DETECTED
Influenza B, NAA: NOT DETECTED
RSV, NAA: NOT DETECTED
SARS-CoV-2, NAA: NOT DETECTED

## 2021-04-29 ENCOUNTER — Other Ambulatory Visit (HOSPITAL_BASED_OUTPATIENT_CLINIC_OR_DEPARTMENT_OTHER): Payer: Self-pay

## 2021-04-29 MED ORDER — AMPHETAMINE SULFATE 10 MG PO TABS
ORAL_TABLET | ORAL | 0 refills | Status: DC
  Filled 2021-05-04: qty 60, 30d supply, fill #0

## 2021-04-29 MED FILL — Atomoxetine HCl Cap 60 MG (Base Equiv): ORAL | 30 days supply | Qty: 30 | Fill #1 | Status: AC

## 2021-04-29 MED FILL — Doxepin HCl (Sleep) Tab 6 MG (Base Equiv): ORAL | 30 days supply | Qty: 30 | Fill #0 | Status: AC

## 2021-04-30 ENCOUNTER — Emergency Department (INDEPENDENT_AMBULATORY_CARE_PROVIDER_SITE_OTHER)
Admission: RE | Admit: 2021-04-30 | Discharge: 2021-04-30 | Disposition: A | Discharge status: Home / Self Care | Payer: 59 | Source: Ambulatory Visit | Attending: Family Medicine | Admitting: Family Medicine

## 2021-04-30 ENCOUNTER — Other Ambulatory Visit: Payer: Self-pay

## 2021-04-30 VITALS — BP 110/69 | HR 83 | Temp 98.6°F | Resp 18 | Ht 63.0 in | Wt 111.0 lb

## 2021-04-30 DIAGNOSIS — J22 Unspecified acute lower respiratory infection: Secondary | ICD-10-CM | POA: Diagnosis not present

## 2021-04-30 MED ORDER — BENZONATATE 200 MG PO CAPS: 200.0000 mg | ORAL_CAPSULE | Freq: Two times a day (BID) | ORAL | 0 refills | Status: AC | PRN

## 2021-04-30 MED ORDER — TOBRAMYCIN 0.3 % OP SOLN: 1.0000 [drp] | OPHTHALMIC | 0 refills | Status: AC

## 2021-04-30 MED ORDER — DOXYCYCLINE HYCLATE 100 MG PO CAPS: 100.0000 mg | ORAL_CAPSULE | Freq: Two times a day (BID) | ORAL | 0 refills | Status: AC

## 2021-04-30 NOTE — ED Provider Notes (Signed)
[facility]    CSN: [id number] Arrival date & time: 04/30/21  0957      History   Chief Complaint Chief Complaint  Patient presents with  . Sore Throat  . Cough  . Eye Problem    R eye is pink    HPI Beth Hernandez is a 50 y.o. female.   HPI   Patient was seen by me last week.  Diagnosed with a viral upper respiratory infection.  Negative testing for COVID, influenza, and RSV.  She is a Print production planner.  She is here because she is feeling worse.  More coughing.  Chest pain with coughing.  Fatigue.  She also has sore throat.  Today she woke up with redness and yellow drainage in her right eye.  She feels like this is pinkeye.  Past Medical History:  Diagnosis Date  . Anal fissure 2010  . Anxiety   . Cystitis, interstitial   . Depression   . IBS (irritable bowel syndrome)   . Insomnia   . Internal hemorrhoids   . Perimenopause     Patient Active Problem List   Diagnosis Date Noted  . Depression 10/27/2015  . Irritable bowel syndrome with constipation 10/27/2015  . Interstitial cystitis 10/27/2015  . Insomnia 10/27/2015  . Perimenopause 10/27/2015  . CAP (community acquired pneumonia) 10/27/2015    Past Surgical History:  Procedure Laterality Date  . BREAST BIOPSY Right    2016  . DENTAL SURGERY     skin grafts to gums   . DILATION AND CURETTAGE OF UTERUS    . FISSURECTOMY     with sphincterotomy  . SPHINCTEROTOMY      OB History   No obstetric history on file.      Home Medications    Prior to Admission medications   Medication Sig Start Date End Date Taking? Authorizing Provider  benzonatate (TESSALON) 200 MG capsule Take 1 capsule (200 mg total) by mouth 2 (two) times daily as needed for cough. 04/30/21  Yes Raylene Everts, MD  doxycycline (VIBRAMYCIN) 100 MG capsule Take 1 capsule (100 mg total) by mouth 2 (two) times daily. 04/30/21  Yes Raylene Everts, MD  tobramycin (TOBREX) 0.3 % ophthalmic solution Place 1 drop into  the right eye every 4 (four) hours. 04/30/21  Yes Raylene Everts, MD  Acetylcysteine 600 MG CAPS Take by mouth.    [provider]  ALPRAZolam Duanne Moron) 0.25 MG tablet Take 0.25 mg by mouth.    [provider]  Amphetamine Sulfate 10 MG TABS Take 1 tablet by mouth twice daily 04/29/21     Ascorbic Acid (VITAMIN C) 1000 MG tablet Take 1,000 mg by mouth daily.     [provider]  atomoxetine (STRATTERA) 60 MG capsule TAKE 1 CAPSULE BY MOUTH ONCE DAILY 09/02/20 09/02/21  Hepler, Elta Guadeloupe, PA-C  Calcium Glycerophosphate (PRELIEF) 340 (65-50) MG (CA-P) TABS Take 4 tablets by mouth 2 (two) times daily before a meal.     [provider]  Cholecalciferol (VITAMIN D) 400 UNITS capsule Take 5,000 Int'l Units by mouth daily.     [provider]  clobetasol cream (TEMOVATE) 0.05 % APPLY TO THE AFFECTED AREAS OF PSORIASIS 2 TIMES DAILY FOR 2 WEEKS, THEN TAKE 1 WEEK OFF AND REPEAT AS NEEDED 11/22/20 11/22/21  Ripley Fraise, MD  docusate sodium (COLACE) 100 MG capsule Take 100 mg by mouth daily.     [provider]  Doxepin HCl 6 MG TABS  TAKE 6 MG TO 9 MG AT BED TIME AS NEEDED FOR SLEEP 03/24/21 03/24/22  Atha Starks, MD  hyoscyamine (LEVSIN SL) 0.125 MG SL tablet Take 0.125 mg by mouth every 4 (four) hours as needed.     [provider]  ketoconazole (NIZORAL) 2 % cream APPLY 1 APPLICATION ON TO THE EARS 2 TIMES DAILY 11/22/20 11/22/21  Ripley Fraise, MD  Magnesium Oxide 400 MG CAPS Take 1 tablet by mouth daily.     [provider]  Omega-3 Fatty Acids (FISH OIL) 500 MG CAPS Take 1,000 mg by mouth daily.     [provider]  polyethylene glycol (MIRALAX / GLYCOLAX) packet Take 17 g by mouth daily.  04/06/14   [provider]  Probiotic Product (ACIDOPHILUS) 90-25 MG CHEW Chew 1 capsule by mouth daily.     [provider]  Sulfacetamide Sodium-Sulfur 10-5 % LOTN Apply daily to face 02/03/14   [provider]   vortioxetine HBr (TRINTELLIX) 20 MG TABS tablet TAKE ONE TABLET (20 MG DOSE) BY MOUTH DAILY. 01/20/21 01/20/22  Atha Starks, MD  zaleplon (SONATA) 10 MG capsule Take 20 mg by mouth at bedtime.   04/26/21  [provider]    Family History Family History  Problem Relation Age of Onset  . Alcohol abuse Father   . Irritable bowel syndrome Father   . Other Father        iliac disease  . Colon polyps Mother   . Colon cancer Maternal Grandmother   . Breast cancer Paternal Grandmother   . Breast cancer Sister   . Breast cancer Paternal Aunt        had Br ca x2  . Esophageal cancer Maternal Grandfather   . Lung cancer Maternal Aunt   . Diabetes Maternal Aunt   . Rectal cancer Neg Hx   . Stomach cancer Neg Hx     Social History Social History   Tobacco Use  . Smoking status: Never Smoker  . Smokeless tobacco: Never Used  Vaping Use  . Vaping Use: Never used  Substance Use Topics  . Alcohol use: No    Alcohol/week: 0.0 standard drinks  . Drug use: No     Allergies   Abilify [aripiprazole], Amphetamines, Atrovent nasal spray [ipratropium], Bacopa monnieri, Bupropion, Cephalexin, Ciprofloxacin, Metadate er [methylphenidate], Penicillins, and Macrobid  [nitrofurantoin monohyd macro]   Review of Systems Review of Systems See HPI  Physical Exam Triage Vital Signs ED Triage Vitals  Enc Vitals Group     BP 04/30/21 1029 110/69     Pulse Rate 04/30/21 1029 83     Resp 04/30/21 1029 18     Temp 04/30/21 1029 98.6 F (37 C)     Temp Source 04/30/21 1029 Oral     SpO2 04/30/21 1029 100 %     Weight 04/30/21 1025 111 lb (50.3 kg)     Height 04/30/21 1025 5\' 3"  (1.6 m)     Head Circumference --      Peak Flow --      Pain Score 04/30/21 1024 2     Pain Loc --      Pain Edu? --      Excl. in [place]? --    No data found.  Updated Vital Signs BP 110/69 (BP Location: Left Arm)   Pulse 83   Temp 98.6 F (37 C) (Oral)   Resp 18   Ht 5\' 3"  (1.6 m)   Wt 50.3 kg  LMP  09/19/2014 Comment: irregular  SpO2 100%   BMI 19.66 kg/m     Physical Exam Constitutional:      General: She is not in acute distress.    Appearance: She is well-developed.     Comments: Hoarse voice  HENT:     Head: Normocephalic and atraumatic.     Right Ear: Tympanic membrane, ear canal and external ear normal.     Left Ear: Tympanic membrane, ear canal and external ear normal.     Nose: Congestion and rhinorrhea present.     Mouth/Throat:     Pharynx: Posterior oropharyngeal erythema present.  Eyes:     Conjunctiva/sclera: Conjunctivae normal.     Pupils: Pupils are equal, round, and reactive to light.  Cardiovascular:     Rate and Rhythm: Normal rate and regular rhythm.     Heart sounds: Normal heart sounds.  Pulmonary:     Effort: Pulmonary effort is normal. No respiratory distress.     Breath sounds: Normal breath sounds.  Abdominal:     General: There is no distension.     Palpations: Abdomen is soft.  Musculoskeletal:        General: Normal range of motion.     Cervical back: Normal range of motion.  Lymphadenopathy:     Cervical: Cervical adenopathy present.  Skin:    General: Skin is warm and dry.  Neurological:     Mental Status: She is alert.  Psychiatric:        Behavior: Behavior normal.      UC Treatments / Results  Labs (all labs ordered are listed, but only abnormal results are displayed) Labs Reviewed - No data to display  EKG   Radiology No results found.  Procedures Procedures (including critical care time)  Medications Ordered in UC Medications - No data to display  Initial Impression / Assessment and Plan / UC Course  I have reviewed the triage vital signs and the nursing notes.  Pertinent labs & imaging results that were available during my care of the patient were reviewed by me and considered in my medical decision making (see chart for details).     Because her viral infection is worsening over time, and has been lasting  longer than a week we will add an antibiotic.  Continue to push fluids.  Over-the-counter medicines discussed. Final Clinical Impressions(s) / UC Diagnoses   Final diagnoses:  LRTI (lower respiratory tract infection)     Discharge Instructions     Continue to drink lots of fluids Take Mucinex to loosen the mucus Take the antibiotic 2 times a day Use the eyedrops every 4 hours until your eye clears Take Tessalon 2 times a day for cough  Expect improvement over next couple of days   ED Prescriptions    Medication Sig Dispense Auth. Provider   tobramycin (TOBREX) 0.3 % ophthalmic solution Place 1 drop into the right eye every 4 (four) hours. 5 mL Raylene Everts, MD   doxycycline (VIBRAMYCIN) 100 MG capsule Take 1 capsule (100 mg total) by mouth 2 (two) times daily. 14 capsule Raylene Everts, MD   benzonatate (TESSALON) 200 MG capsule Take 1 capsule (200 mg total) by mouth 2 (two) times daily as needed for cough. 20 capsule Raylene Everts, MD     PDMP not reviewed this encounter.   Raylene Everts, MD 04/30/21 1146

## 2021-04-30 NOTE — Discharge Instructions (Addendum)
Continue to drink lots of fluids Take Mucinex to loosen the mucus Take the antibiotic 2 times a day Use the eyedrops every 4 hours until your eye clears Take Tessalon 2 times a day for cough  Expect improvement over next couple of days

## 2021-04-30 NOTE — ED Triage Notes (Addendum)
Pt presents to Urgent Care with c/o continued symptoms of cough and sore throat since last visit on 04/26/21 (was negative for COVID, flu, and RSV). Also developed pinkness in R eye last night.

## 2021-05-02 ENCOUNTER — Other Ambulatory Visit (HOSPITAL_BASED_OUTPATIENT_CLINIC_OR_DEPARTMENT_OTHER): Payer: Self-pay

## 2021-05-03 ENCOUNTER — Other Ambulatory Visit (HOSPITAL_BASED_OUTPATIENT_CLINIC_OR_DEPARTMENT_OTHER): Payer: Self-pay

## 2021-05-03 MED FILL — Vortioxetine HBr Tab 20 MG (Base Equiv): ORAL | 30 days supply | Qty: 30 | Fill #0 | Status: AC

## 2021-05-04 ENCOUNTER — Other Ambulatory Visit (HOSPITAL_BASED_OUTPATIENT_CLINIC_OR_DEPARTMENT_OTHER): Payer: Self-pay

## 2021-05-31 ENCOUNTER — Other Ambulatory Visit (HOSPITAL_BASED_OUTPATIENT_CLINIC_OR_DEPARTMENT_OTHER): Payer: Self-pay

## 2021-05-31 MED FILL — Vortioxetine HBr Tab 20 MG (Base Equiv): ORAL | 30 days supply | Qty: 30 | Fill #1 | Status: AC

## 2021-05-31 MED FILL — Atomoxetine HCl Cap 60 MG (Base Equiv): ORAL | 30 days supply | Qty: 30 | Fill #2 | Status: AC

## 2021-06-01 ENCOUNTER — Other Ambulatory Visit (HOSPITAL_BASED_OUTPATIENT_CLINIC_OR_DEPARTMENT_OTHER): Payer: Self-pay

## 2021-06-01 MED FILL — Doxepin HCl (Sleep) Tab 6 MG (Base Equiv): ORAL | 30 days supply | Qty: 30 | Fill #1 | Status: AC

## 2021-06-02 ENCOUNTER — Other Ambulatory Visit (HOSPITAL_BASED_OUTPATIENT_CLINIC_OR_DEPARTMENT_OTHER): Payer: Self-pay

## 2021-06-02 DIAGNOSIS — F902 Attention-deficit hyperactivity disorder, combined type: Secondary | ICD-10-CM | POA: Diagnosis not present

## 2021-06-02 DIAGNOSIS — Z79899 Other long term (current) drug therapy: Secondary | ICD-10-CM | POA: Diagnosis not present

## 2021-06-02 DIAGNOSIS — F419 Anxiety disorder, unspecified: Secondary | ICD-10-CM | POA: Diagnosis not present

## 2021-06-02 MED ORDER — AMPHETAMINE SULFATE 10 MG PO TABS
ORAL_TABLET | ORAL | 0 refills | Status: DC
  Filled 2021-06-02: qty 60, 30d supply, fill #0

## 2021-06-10 DIAGNOSIS — Z01419 Encounter for gynecological examination (general) (routine) without abnormal findings: Secondary | ICD-10-CM | POA: Diagnosis not present

## 2021-06-10 DIAGNOSIS — Z0000 Encounter for general adult medical examination without abnormal findings: Secondary | ICD-10-CM | POA: Diagnosis not present

## 2021-07-01 ENCOUNTER — Other Ambulatory Visit (HOSPITAL_BASED_OUTPATIENT_CLINIC_OR_DEPARTMENT_OTHER): Payer: Self-pay

## 2021-07-01 MED ORDER — AMPHETAMINE SULFATE 10 MG PO TABS
ORAL_TABLET | ORAL | 0 refills | Status: DC
  Filled 2021-07-01 (×2): qty 60, 30d supply, fill #0

## 2021-07-01 MED FILL — Doxepin HCl (Sleep) Tab 6 MG (Base Equiv): ORAL | 30 days supply | Qty: 30 | Fill #2 | Status: AC

## 2021-07-01 MED FILL — Atomoxetine HCl Cap 60 MG (Base Equiv): ORAL | 30 days supply | Qty: 30 | Fill #3 | Status: AC

## 2021-07-01 MED FILL — Vortioxetine HBr Tab 20 MG (Base Equiv): ORAL | 30 days supply | Qty: 30 | Fill #2 | Status: AC

## 2021-07-04 ENCOUNTER — Other Ambulatory Visit (HOSPITAL_BASED_OUTPATIENT_CLINIC_OR_DEPARTMENT_OTHER): Payer: Self-pay

## 2021-07-05 ENCOUNTER — Other Ambulatory Visit (HOSPITAL_BASED_OUTPATIENT_CLINIC_OR_DEPARTMENT_OTHER): Payer: Self-pay

## 2021-07-05 DIAGNOSIS — F411 Generalized anxiety disorder: Secondary | ICD-10-CM | POA: Diagnosis not present

## 2021-07-05 DIAGNOSIS — F5105 Insomnia due to other mental disorder: Secondary | ICD-10-CM | POA: Diagnosis not present

## 2021-07-05 DIAGNOSIS — F988 Other specified behavioral and emotional disorders with onset usually occurring in childhood and adolescence: Secondary | ICD-10-CM | POA: Diagnosis not present

## 2021-07-05 DIAGNOSIS — F330 Major depressive disorder, recurrent, mild: Secondary | ICD-10-CM | POA: Diagnosis not present

## 2021-07-05 MED ORDER — DOXEPIN HCL 6 MG PO TABS
ORAL_TABLET | ORAL | 11 refills | Status: AC
  Filled 2021-07-05 – 2021-08-31 (×2): qty 30, 30d supply, fill #0
  Filled 2021-09-27: qty 30, 30d supply, fill #1
  Filled 2021-10-27: qty 30, 30d supply, fill #2
  Filled 2021-11-24: qty 30, 30d supply, fill #3
  Filled 2021-12-26: qty 30, 30d supply, fill #4
  Filled 2022-01-31: qty 30, 30d supply, fill #5
  Filled 2022-03-02: qty 30, 30d supply, fill #6
  Filled 2022-04-03: qty 30, 30d supply, fill #7

## 2021-08-01 ENCOUNTER — Other Ambulatory Visit (HOSPITAL_BASED_OUTPATIENT_CLINIC_OR_DEPARTMENT_OTHER): Payer: Self-pay

## 2021-08-01 MED FILL — Doxepin HCl (Sleep) Tab 6 MG (Base Equiv): ORAL | 30 days supply | Qty: 30 | Fill #3 | Status: AC

## 2021-08-01 MED FILL — Vortioxetine HBr Tab 20 MG (Base Equiv): ORAL | 30 days supply | Qty: 30 | Fill #3 | Status: AC

## 2021-08-01 MED FILL — Atomoxetine HCl Cap 60 MG (Base Equiv): ORAL | 30 days supply | Qty: 30 | Fill #4 | Status: AC

## 2021-08-02 ENCOUNTER — Other Ambulatory Visit (HOSPITAL_BASED_OUTPATIENT_CLINIC_OR_DEPARTMENT_OTHER): Payer: Self-pay

## 2021-08-02 MED ORDER — AMPHETAMINE SULFATE 10 MG PO TABS
ORAL_TABLET | ORAL | 0 refills | Status: DC
  Filled 2021-08-02: qty 60, 30d supply, fill #0

## 2021-08-04 ENCOUNTER — Other Ambulatory Visit (HOSPITAL_BASED_OUTPATIENT_CLINIC_OR_DEPARTMENT_OTHER): Payer: Self-pay

## 2021-08-04 MED ORDER — ZOSTER VAC RECOMB ADJUVANTED 50 MCG/0.5ML IM SUSR
INTRAMUSCULAR | 1 refills | Status: AC
  Filled 2021-08-04: qty 0.5, 1d supply, fill #0
  Filled 2022-02-01: qty 0.5, 1d supply, fill #1

## 2021-08-15 DIAGNOSIS — Z20822 Contact with and (suspected) exposure to covid-19: Secondary | ICD-10-CM | POA: Diagnosis not present

## 2021-08-30 ENCOUNTER — Other Ambulatory Visit (HOSPITAL_BASED_OUTPATIENT_CLINIC_OR_DEPARTMENT_OTHER): Payer: Self-pay

## 2021-08-30 MED FILL — Vortioxetine HBr Tab 20 MG (Base Equiv): ORAL | 30 days supply | Qty: 30 | Fill #4 | Status: AC

## 2021-08-30 MED FILL — Doxepin HCl (Sleep) Tab 6 MG (Base Equiv): ORAL | 30 days supply | Qty: 30 | Fill #4 | Status: CN

## 2021-08-31 ENCOUNTER — Other Ambulatory Visit (HOSPITAL_BASED_OUTPATIENT_CLINIC_OR_DEPARTMENT_OTHER): Payer: Self-pay

## 2021-08-31 MED ORDER — ATOMOXETINE HCL 60 MG PO CAPS
ORAL_CAPSULE | ORAL | 11 refills | Status: DC
  Filled 2021-08-31: qty 30, 30d supply, fill #0
  Filled 2021-09-27: qty 30, 30d supply, fill #1
  Filled 2021-10-27: qty 30, 30d supply, fill #2
  Filled 2021-11-24: qty 30, 30d supply, fill #3
  Filled 2021-12-26: qty 30, 30d supply, fill #4
  Filled 2022-01-25: qty 30, 30d supply, fill #5
  Filled 2022-03-02: qty 30, 30d supply, fill #6
  Filled 2022-03-26: qty 30, 30d supply, fill #7
  Filled 2022-04-24: qty 30, 30d supply, fill #8
  Filled 2022-05-22: qty 30, 30d supply, fill #9
  Filled 2022-06-19: qty 30, 30d supply, fill #10
  Filled 2022-07-21: qty 30, 30d supply, fill #11

## 2021-09-01 ENCOUNTER — Other Ambulatory Visit (HOSPITAL_BASED_OUTPATIENT_CLINIC_OR_DEPARTMENT_OTHER): Payer: Self-pay

## 2021-09-01 DIAGNOSIS — F902 Attention-deficit hyperactivity disorder, combined type: Secondary | ICD-10-CM | POA: Diagnosis not present

## 2021-09-01 DIAGNOSIS — Z79899 Other long term (current) drug therapy: Secondary | ICD-10-CM | POA: Diagnosis not present

## 2021-09-01 MED ORDER — AMPHETAMINE SULFATE 10 MG PO TABS
ORAL_TABLET | ORAL | 0 refills | Status: DC
  Filled 2021-09-01: qty 60, 30d supply, fill #0

## 2021-09-02 DIAGNOSIS — F902 Attention-deficit hyperactivity disorder, combined type: Secondary | ICD-10-CM | POA: Diagnosis not present

## 2021-09-09 ENCOUNTER — Other Ambulatory Visit (HOSPITAL_BASED_OUTPATIENT_CLINIC_OR_DEPARTMENT_OTHER): Payer: Self-pay

## 2021-09-22 DIAGNOSIS — H524 Presbyopia: Secondary | ICD-10-CM | POA: Diagnosis not present

## 2021-09-27 MED FILL — Vortioxetine HBr Tab 20 MG (Base Equiv): ORAL | 30 days supply | Qty: 30 | Fill #5 | Status: AC

## 2021-09-28 ENCOUNTER — Other Ambulatory Visit (HOSPITAL_BASED_OUTPATIENT_CLINIC_OR_DEPARTMENT_OTHER): Payer: Self-pay

## 2021-10-03 ENCOUNTER — Other Ambulatory Visit (HOSPITAL_BASED_OUTPATIENT_CLINIC_OR_DEPARTMENT_OTHER): Payer: Self-pay

## 2021-10-03 MED ORDER — AMPHETAMINE SULFATE 10 MG PO TABS
10.0000 mg | ORAL_TABLET | Freq: Two times a day (BID) | ORAL | 0 refills | Status: DC
  Filled 2021-10-03: qty 60, 30d supply, fill #0

## 2021-10-11 ENCOUNTER — Other Ambulatory Visit: Payer: Self-pay | Admitting: *Deleted

## 2021-10-11 DIAGNOSIS — Z1231 Encounter for screening mammogram for malignant neoplasm of breast: Secondary | ICD-10-CM

## 2021-10-13 ENCOUNTER — Other Ambulatory Visit (HOSPITAL_BASED_OUTPATIENT_CLINIC_OR_DEPARTMENT_OTHER): Payer: Self-pay

## 2021-10-13 DIAGNOSIS — F411 Generalized anxiety disorder: Secondary | ICD-10-CM | POA: Diagnosis not present

## 2021-10-13 DIAGNOSIS — F330 Major depressive disorder, recurrent, mild: Secondary | ICD-10-CM | POA: Diagnosis not present

## 2021-10-13 DIAGNOSIS — F5105 Insomnia due to other mental disorder: Secondary | ICD-10-CM | POA: Diagnosis not present

## 2021-10-13 MED ORDER — TRINTELLIX 20 MG PO TABS
ORAL_TABLET | ORAL | 5 refills | Status: AC
  Filled 2021-10-13 – 2021-10-27 (×2): qty 30, 30d supply, fill #0
  Filled 2021-11-24: qty 30, 30d supply, fill #1
  Filled 2021-12-26: qty 30, 30d supply, fill #2
  Filled 2022-01-25: qty 30, 30d supply, fill #3
  Filled 2022-02-20: qty 30, 30d supply, fill #4
  Filled 2022-03-26: qty 30, 30d supply, fill #5

## 2021-10-20 ENCOUNTER — Ambulatory Visit (INDEPENDENT_AMBULATORY_CARE_PROVIDER_SITE_OTHER): Payer: 59

## 2021-10-20 ENCOUNTER — Other Ambulatory Visit: Payer: Self-pay

## 2021-10-20 DIAGNOSIS — Z1231 Encounter for screening mammogram for malignant neoplasm of breast: Secondary | ICD-10-CM | POA: Diagnosis not present

## 2021-10-28 ENCOUNTER — Other Ambulatory Visit (HOSPITAL_BASED_OUTPATIENT_CLINIC_OR_DEPARTMENT_OTHER): Payer: Self-pay

## 2021-10-31 ENCOUNTER — Other Ambulatory Visit (HOSPITAL_BASED_OUTPATIENT_CLINIC_OR_DEPARTMENT_OTHER): Payer: Self-pay

## 2021-10-31 MED ORDER — AMPHETAMINE SULFATE 10 MG PO TABS
ORAL_TABLET | ORAL | 0 refills | Status: DC
  Filled 2021-10-31: qty 60, 30d supply, fill #0

## 2021-11-24 ENCOUNTER — Other Ambulatory Visit (HOSPITAL_BASED_OUTPATIENT_CLINIC_OR_DEPARTMENT_OTHER): Payer: Self-pay

## 2021-11-30 ENCOUNTER — Other Ambulatory Visit (HOSPITAL_BASED_OUTPATIENT_CLINIC_OR_DEPARTMENT_OTHER): Payer: Self-pay

## 2021-11-30 MED ORDER — AMPHETAMINE SULFATE 10 MG PO TABS
ORAL_TABLET | ORAL | 0 refills | Status: DC
  Filled 2021-11-30: qty 60, 30d supply, fill #0

## 2021-12-02 ENCOUNTER — Other Ambulatory Visit (HOSPITAL_BASED_OUTPATIENT_CLINIC_OR_DEPARTMENT_OTHER): Payer: Self-pay

## 2021-12-26 ENCOUNTER — Other Ambulatory Visit (HOSPITAL_BASED_OUTPATIENT_CLINIC_OR_DEPARTMENT_OTHER): Payer: Self-pay

## 2021-12-29 DIAGNOSIS — F419 Anxiety disorder, unspecified: Secondary | ICD-10-CM | POA: Diagnosis not present

## 2022-01-02 ENCOUNTER — Other Ambulatory Visit (HOSPITAL_BASED_OUTPATIENT_CLINIC_OR_DEPARTMENT_OTHER): Payer: Self-pay

## 2022-01-02 MED ORDER — AMPHETAMINE SULFATE 10 MG PO TABS
ORAL_TABLET | ORAL | 0 refills | Status: DC
  Filled 2022-01-02: qty 60, 30d supply, fill #0

## 2022-01-17 ENCOUNTER — Other Ambulatory Visit (HOSPITAL_BASED_OUTPATIENT_CLINIC_OR_DEPARTMENT_OTHER): Payer: Self-pay

## 2022-01-17 DIAGNOSIS — F5105 Insomnia due to other mental disorder: Secondary | ICD-10-CM | POA: Diagnosis not present

## 2022-01-17 DIAGNOSIS — F988 Other specified behavioral and emotional disorders with onset usually occurring in childhood and adolescence: Secondary | ICD-10-CM | POA: Diagnosis not present

## 2022-01-17 DIAGNOSIS — F331 Major depressive disorder, recurrent, moderate: Secondary | ICD-10-CM | POA: Diagnosis not present

## 2022-01-17 DIAGNOSIS — F411 Generalized anxiety disorder: Secondary | ICD-10-CM | POA: Diagnosis not present

## 2022-01-17 MED ORDER — MIRTAZAPINE 7.5 MG PO TABS
3.7500 mg | ORAL_TABLET | Freq: Every day | ORAL | 1 refills | Status: DC
  Filled 2022-01-17: qty 15, 30d supply, fill #0
  Filled 2022-02-21: qty 15, 30d supply, fill #1

## 2022-01-25 ENCOUNTER — Other Ambulatory Visit (HOSPITAL_BASED_OUTPATIENT_CLINIC_OR_DEPARTMENT_OTHER): Payer: Self-pay

## 2022-01-31 ENCOUNTER — Other Ambulatory Visit (HOSPITAL_BASED_OUTPATIENT_CLINIC_OR_DEPARTMENT_OTHER): Payer: Self-pay

## 2022-01-31 MED ORDER — AMPHETAMINE SULFATE 10 MG PO TABS
ORAL_TABLET | ORAL | 0 refills | Status: DC
  Filled 2022-01-31: qty 60, 30d supply, fill #0

## 2022-02-01 ENCOUNTER — Other Ambulatory Visit (HOSPITAL_BASED_OUTPATIENT_CLINIC_OR_DEPARTMENT_OTHER): Payer: Self-pay

## 2022-02-02 DIAGNOSIS — F419 Anxiety disorder, unspecified: Secondary | ICD-10-CM | POA: Diagnosis not present

## 2022-02-21 ENCOUNTER — Other Ambulatory Visit (HOSPITAL_BASED_OUTPATIENT_CLINIC_OR_DEPARTMENT_OTHER): Payer: Self-pay

## 2022-02-22 ENCOUNTER — Other Ambulatory Visit (HOSPITAL_BASED_OUTPATIENT_CLINIC_OR_DEPARTMENT_OTHER): Payer: Self-pay

## 2022-02-22 DIAGNOSIS — F330 Major depressive disorder, recurrent, mild: Secondary | ICD-10-CM | POA: Diagnosis not present

## 2022-02-22 DIAGNOSIS — F5105 Insomnia due to other mental disorder: Secondary | ICD-10-CM | POA: Diagnosis not present

## 2022-02-22 DIAGNOSIS — F411 Generalized anxiety disorder: Secondary | ICD-10-CM | POA: Diagnosis not present

## 2022-02-22 DIAGNOSIS — F988 Other specified behavioral and emotional disorders with onset usually occurring in childhood and adolescence: Secondary | ICD-10-CM | POA: Diagnosis not present

## 2022-02-22 MED ORDER — TRINTELLIX 20 MG PO TABS
ORAL_TABLET | ORAL | 5 refills | Status: AC
  Filled 2022-04-24: qty 30, 30d supply, fill #0
  Filled 2022-05-22: qty 30, 30d supply, fill #1
  Filled 2022-06-19: qty 30, 30d supply, fill #2
  Filled 2022-07-21: qty 30, 30d supply, fill #3
  Filled 2022-08-21: qty 30, 30d supply, fill #4
  Filled 2022-09-18: qty 30, 30d supply, fill #5

## 2022-02-22 MED ORDER — MIRTAZAPINE 7.5 MG PO TABS
ORAL_TABLET | ORAL | 1 refills | Status: DC
  Filled 2022-03-02 (×2): qty 30, 30d supply, fill #0
  Filled 2022-03-06: qty 30, fill #0
  Filled 2022-03-08: qty 30, 30d supply, fill #0
  Filled 2022-04-03: qty 30, 30d supply, fill #1

## 2022-03-01 ENCOUNTER — Other Ambulatory Visit (HOSPITAL_BASED_OUTPATIENT_CLINIC_OR_DEPARTMENT_OTHER): Payer: Self-pay

## 2022-03-01 DIAGNOSIS — F902 Attention-deficit hyperactivity disorder, combined type: Secondary | ICD-10-CM | POA: Diagnosis not present

## 2022-03-01 DIAGNOSIS — Z79899 Other long term (current) drug therapy: Secondary | ICD-10-CM | POA: Diagnosis not present

## 2022-03-01 DIAGNOSIS — F419 Anxiety disorder, unspecified: Secondary | ICD-10-CM | POA: Diagnosis not present

## 2022-03-01 MED ORDER — AMPHETAMINE SULFATE 10 MG PO TABS
ORAL_TABLET | ORAL | 0 refills | Status: DC
  Filled 2022-03-01: qty 60, 30d supply, fill #0

## 2022-03-02 ENCOUNTER — Other Ambulatory Visit (HOSPITAL_BASED_OUTPATIENT_CLINIC_OR_DEPARTMENT_OTHER): Payer: Self-pay

## 2022-03-06 ENCOUNTER — Other Ambulatory Visit (HOSPITAL_BASED_OUTPATIENT_CLINIC_OR_DEPARTMENT_OTHER): Payer: Self-pay

## 2022-03-08 ENCOUNTER — Other Ambulatory Visit (HOSPITAL_BASED_OUTPATIENT_CLINIC_OR_DEPARTMENT_OTHER): Payer: Self-pay

## 2022-03-09 DIAGNOSIS — F419 Anxiety disorder, unspecified: Secondary | ICD-10-CM | POA: Diagnosis not present

## 2022-03-27 ENCOUNTER — Other Ambulatory Visit (HOSPITAL_BASED_OUTPATIENT_CLINIC_OR_DEPARTMENT_OTHER): Payer: Self-pay

## 2022-03-29 ENCOUNTER — Other Ambulatory Visit (HOSPITAL_BASED_OUTPATIENT_CLINIC_OR_DEPARTMENT_OTHER): Payer: Self-pay

## 2022-03-29 DIAGNOSIS — F419 Anxiety disorder, unspecified: Secondary | ICD-10-CM | POA: Diagnosis not present

## 2022-03-29 MED ORDER — AMPHETAMINE SULFATE 10 MG PO TABS
ORAL_TABLET | ORAL | 0 refills | Status: DC
  Filled 2022-03-29: qty 60, 30d supply, fill #0

## 2022-04-03 ENCOUNTER — Other Ambulatory Visit (HOSPITAL_BASED_OUTPATIENT_CLINIC_OR_DEPARTMENT_OTHER): Payer: Self-pay

## 2022-04-05 ENCOUNTER — Other Ambulatory Visit (HOSPITAL_BASED_OUTPATIENT_CLINIC_OR_DEPARTMENT_OTHER): Payer: Self-pay

## 2022-04-06 ENCOUNTER — Other Ambulatory Visit (HOSPITAL_BASED_OUTPATIENT_CLINIC_OR_DEPARTMENT_OTHER): Payer: Self-pay

## 2022-04-12 ENCOUNTER — Other Ambulatory Visit (HOSPITAL_BASED_OUTPATIENT_CLINIC_OR_DEPARTMENT_OTHER): Payer: Self-pay

## 2022-04-12 DIAGNOSIS — F988 Other specified behavioral and emotional disorders with onset usually occurring in childhood and adolescence: Secondary | ICD-10-CM | POA: Diagnosis not present

## 2022-04-12 DIAGNOSIS — F411 Generalized anxiety disorder: Secondary | ICD-10-CM | POA: Diagnosis not present

## 2022-04-12 DIAGNOSIS — F331 Major depressive disorder, recurrent, moderate: Secondary | ICD-10-CM | POA: Diagnosis not present

## 2022-04-12 DIAGNOSIS — F5105 Insomnia due to other mental disorder: Secondary | ICD-10-CM | POA: Diagnosis not present

## 2022-04-12 MED ORDER — MIRTAZAPINE 7.5 MG PO TABS
ORAL_TABLET | ORAL | 1 refills | Status: DC
  Filled 2022-05-07: qty 45, 30d supply, fill #0
  Filled 2022-06-06: qty 45, 30d supply, fill #1

## 2022-04-24 ENCOUNTER — Other Ambulatory Visit (HOSPITAL_BASED_OUTPATIENT_CLINIC_OR_DEPARTMENT_OTHER): Payer: Self-pay

## 2022-04-26 DIAGNOSIS — F419 Anxiety disorder, unspecified: Secondary | ICD-10-CM | POA: Diagnosis not present

## 2022-05-01 ENCOUNTER — Other Ambulatory Visit (HOSPITAL_BASED_OUTPATIENT_CLINIC_OR_DEPARTMENT_OTHER): Payer: Self-pay

## 2022-05-01 MED ORDER — AMPHETAMINE SULFATE 10 MG PO TABS
ORAL_TABLET | ORAL | 0 refills | Status: DC
  Filled 2022-05-01: qty 60, 30d supply, fill #0

## 2022-05-08 ENCOUNTER — Other Ambulatory Visit (HOSPITAL_BASED_OUTPATIENT_CLINIC_OR_DEPARTMENT_OTHER): Payer: Self-pay

## 2022-05-23 ENCOUNTER — Other Ambulatory Visit (HOSPITAL_BASED_OUTPATIENT_CLINIC_OR_DEPARTMENT_OTHER): Payer: Self-pay

## 2022-05-30 ENCOUNTER — Other Ambulatory Visit (HOSPITAL_BASED_OUTPATIENT_CLINIC_OR_DEPARTMENT_OTHER): Payer: Self-pay

## 2022-05-30 MED ORDER — AMPHETAMINE SULFATE 10 MG PO TABS
ORAL_TABLET | ORAL | 0 refills | Status: DC
  Filled 2022-05-30: qty 60, 30d supply, fill #0

## 2022-06-06 ENCOUNTER — Other Ambulatory Visit (HOSPITAL_BASED_OUTPATIENT_CLINIC_OR_DEPARTMENT_OTHER): Payer: Self-pay

## 2022-06-16 IMAGING — MG MM DIGITAL SCREENING BILAT W/ TOMO AND CAD
8 series · 9 of 24 positions shown · non-contrast
Comparison: Previous exam(s).

CLINICAL DATA: Screening.

EXAM:
DIGITAL SCREENING BILATERAL MAMMOGRAM WITH TOMOSYNTHESIS AND CAD
TECHNIQUE: Bilateral screening digital craniocaudal and mediolateral oblique
mammograms were obtained. Bilateral screening digital breast
tomosynthesis was performed. The images were evaluated with
computer-aided detection.

[L CC synth-2D]
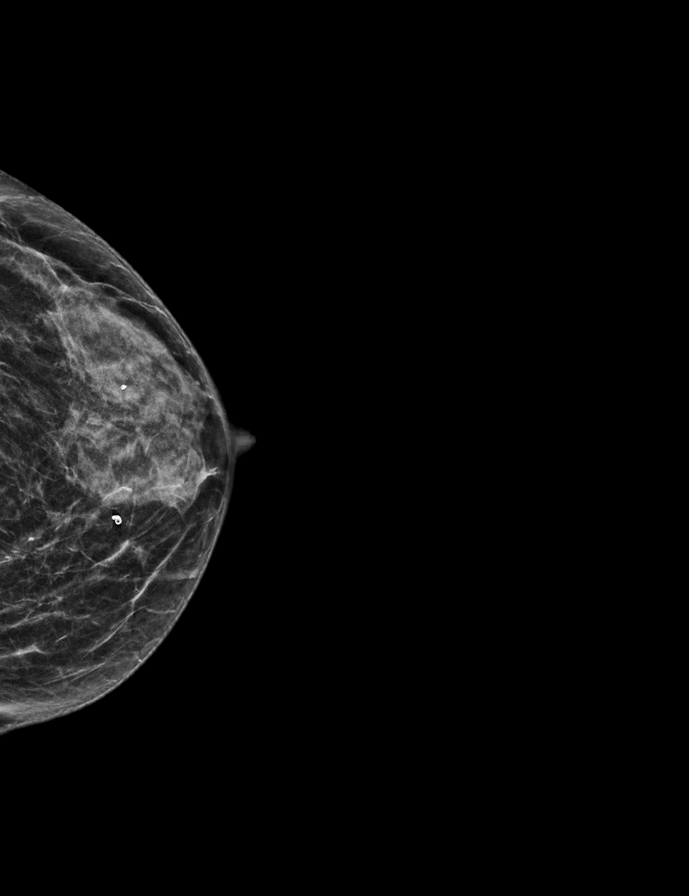

[L MLO synth-2D]
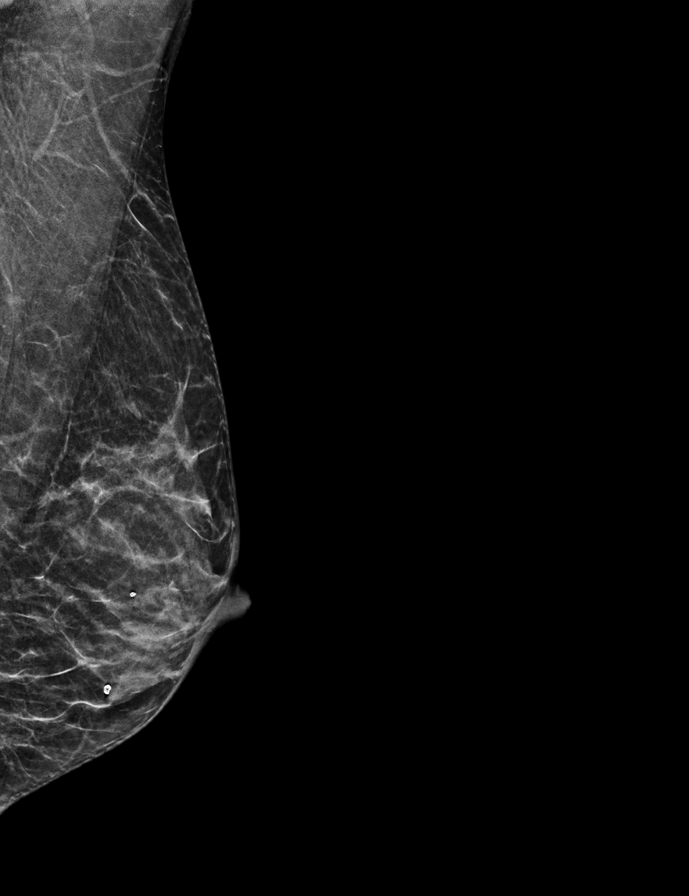

[R CC synth-2D]
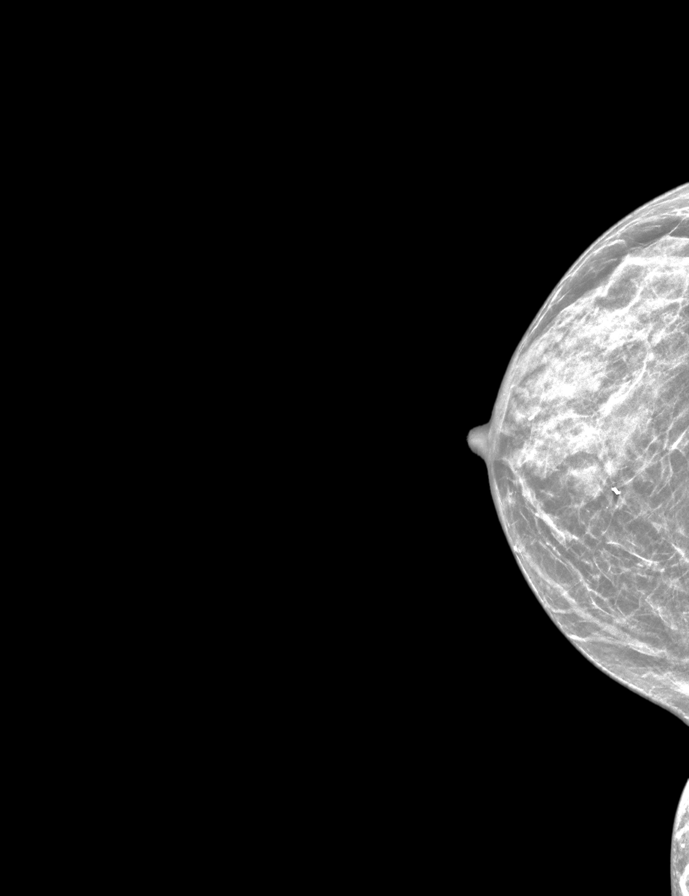

[R MLO synth-2D]
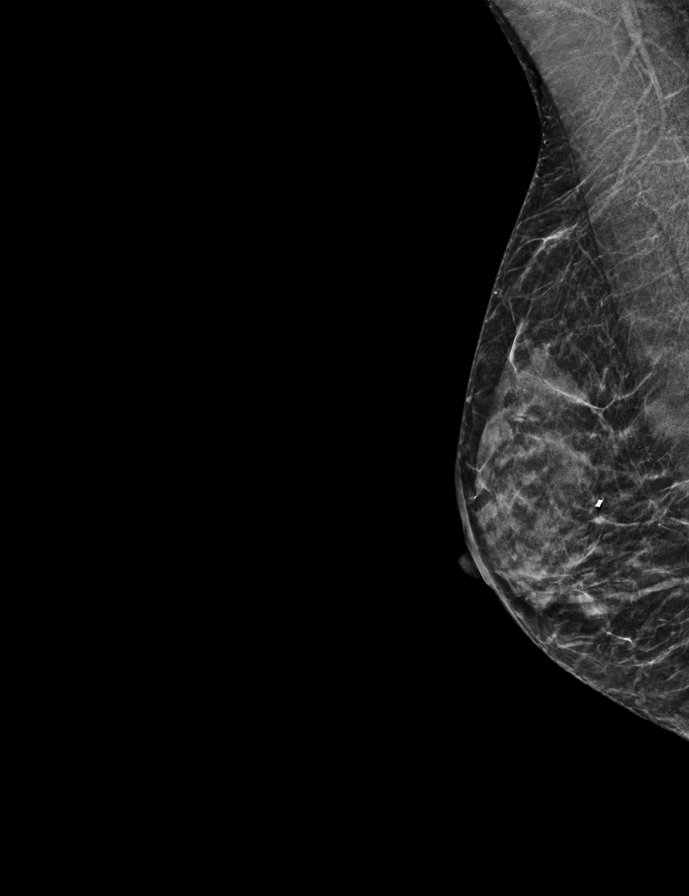

[R CC tomo · 2 of 35 frames shown]
[frame 12/35]
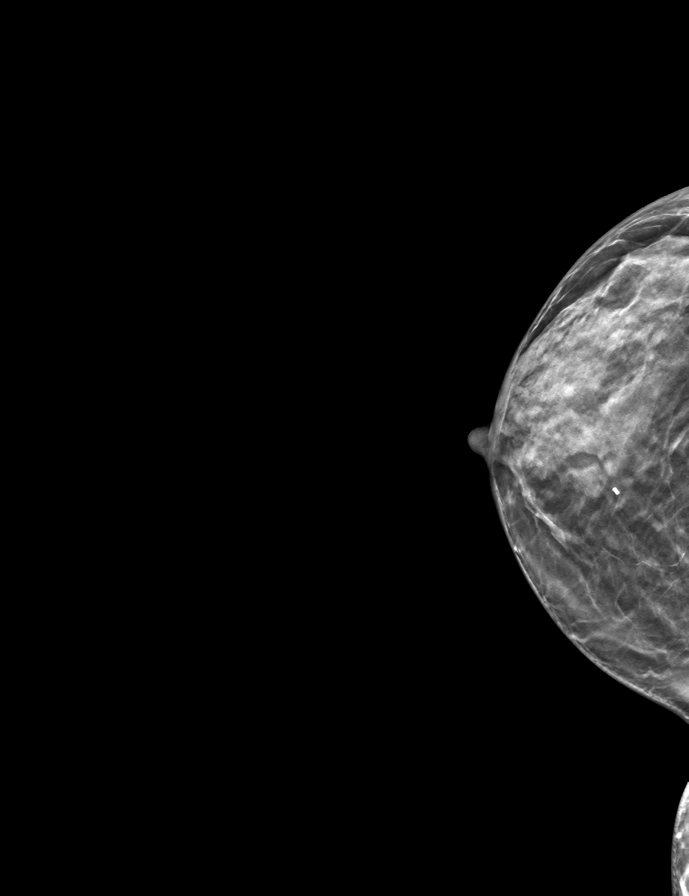
[frame 18/35]
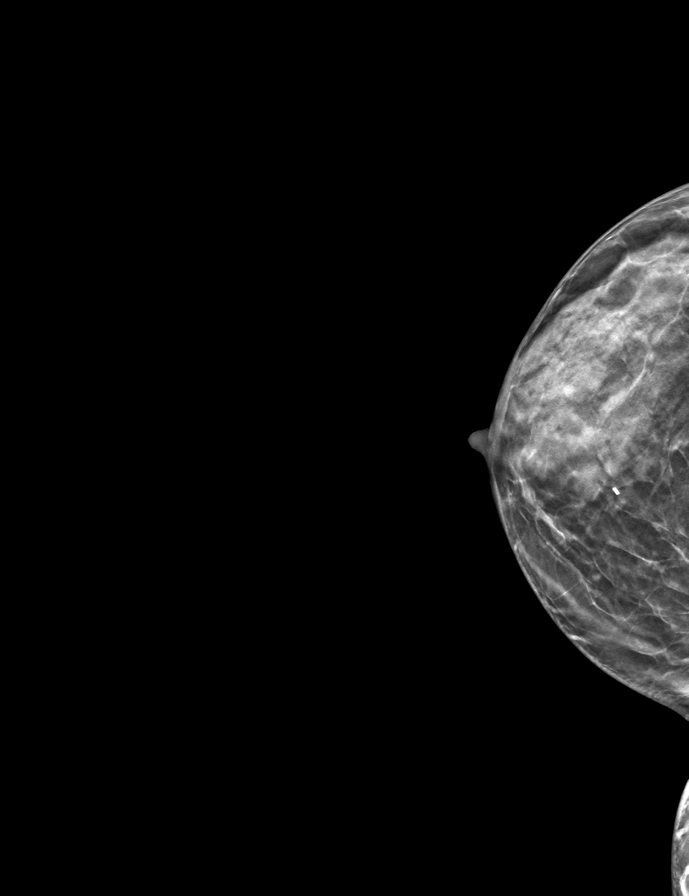

[L MLO tomo · tomo slice 18/35.0]
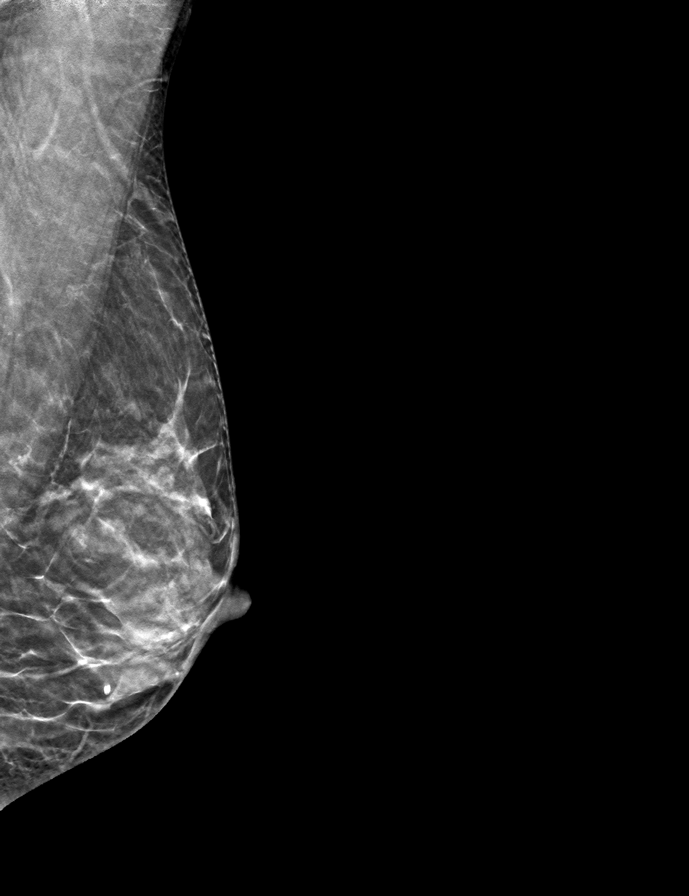

[L CC tomo · tomo slice 19/37.0]
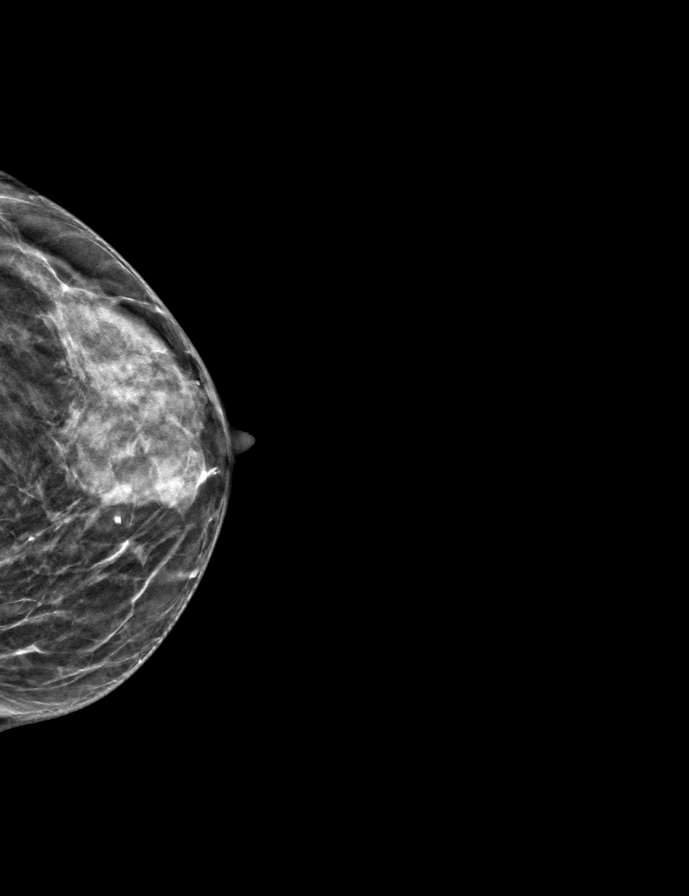

[R MLO tomo · tomo slice 17/34.0]
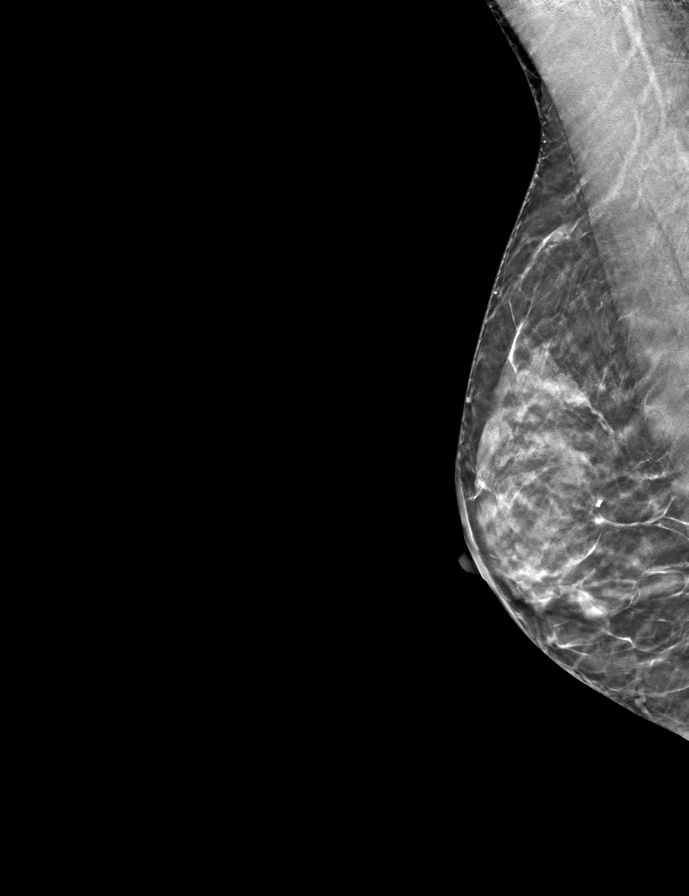

[9 of 24 positions shown; findings below may reference images not displayed]

ACR Breast Density Category c: The breast tissue is heterogeneously
dense, which may obscure small masses.
FINDINGS: There are no findings suspicious for malignancy.
IMPRESSION: No mammographic evidence of malignancy. A result letter of this
screening mammogram will be mailed directly to the patient.

RECOMMENDATION:
Screening mammogram in one year. (Code:Q3-W-BC3)

BI-RADS CATEGORY  1: Negative.

## 2022-06-19 ENCOUNTER — Other Ambulatory Visit (HOSPITAL_BASED_OUTPATIENT_CLINIC_OR_DEPARTMENT_OTHER): Payer: Self-pay

## 2022-06-28 ENCOUNTER — Other Ambulatory Visit (HOSPITAL_BASED_OUTPATIENT_CLINIC_OR_DEPARTMENT_OTHER): Payer: Self-pay

## 2022-06-28 MED ORDER — AMPHETAMINE SULFATE 10 MG PO TABS
10.0000 mg | ORAL_TABLET | Freq: Two times a day (BID) | ORAL | 0 refills | Status: DC
  Filled 2022-06-28: qty 60, 30d supply, fill #0

## 2022-06-29 ENCOUNTER — Other Ambulatory Visit (HOSPITAL_BASED_OUTPATIENT_CLINIC_OR_DEPARTMENT_OTHER): Payer: Self-pay

## 2022-07-07 ENCOUNTER — Other Ambulatory Visit (HOSPITAL_BASED_OUTPATIENT_CLINIC_OR_DEPARTMENT_OTHER): Payer: Self-pay

## 2022-07-07 MED ORDER — MIRTAZAPINE 7.5 MG PO TABS
11.2500 mg | ORAL_TABLET | Freq: Every day | ORAL | 1 refills | Status: DC
  Filled 2022-07-07: qty 45, 30d supply, fill #0
  Filled 2022-08-07: qty 45, 30d supply, fill #1

## 2022-07-10 DIAGNOSIS — Z0000 Encounter for general adult medical examination without abnormal findings: Secondary | ICD-10-CM | POA: Diagnosis not present

## 2022-07-10 DIAGNOSIS — F439 Reaction to severe stress, unspecified: Secondary | ICD-10-CM | POA: Diagnosis not present

## 2022-07-13 DIAGNOSIS — F419 Anxiety disorder, unspecified: Secondary | ICD-10-CM | POA: Diagnosis not present

## 2022-07-21 ENCOUNTER — Other Ambulatory Visit (HOSPITAL_BASED_OUTPATIENT_CLINIC_OR_DEPARTMENT_OTHER): Payer: Self-pay

## 2022-07-26 ENCOUNTER — Other Ambulatory Visit (HOSPITAL_BASED_OUTPATIENT_CLINIC_OR_DEPARTMENT_OTHER): Payer: Self-pay

## 2022-07-26 MED ORDER — AMPHETAMINE SULFATE 10 MG PO TABS
10.0000 mg | ORAL_TABLET | Freq: Two times a day (BID) | ORAL | 0 refills | Status: DC
  Filled 2022-07-26 – 2022-07-28 (×2): qty 60, 30d supply, fill #0

## 2022-07-28 ENCOUNTER — Other Ambulatory Visit (HOSPITAL_BASED_OUTPATIENT_CLINIC_OR_DEPARTMENT_OTHER): Payer: Self-pay

## 2022-07-31 DIAGNOSIS — Z0000 Encounter for general adult medical examination without abnormal findings: Secondary | ICD-10-CM | POA: Diagnosis not present

## 2022-08-07 ENCOUNTER — Other Ambulatory Visit (HOSPITAL_BASED_OUTPATIENT_CLINIC_OR_DEPARTMENT_OTHER): Payer: Self-pay

## 2022-08-08 DIAGNOSIS — F419 Anxiety disorder, unspecified: Secondary | ICD-10-CM | POA: Diagnosis not present

## 2022-08-21 ENCOUNTER — Other Ambulatory Visit (HOSPITAL_BASED_OUTPATIENT_CLINIC_OR_DEPARTMENT_OTHER): Payer: Self-pay

## 2022-08-29 ENCOUNTER — Other Ambulatory Visit (HOSPITAL_BASED_OUTPATIENT_CLINIC_OR_DEPARTMENT_OTHER): Payer: Self-pay

## 2022-08-29 DIAGNOSIS — F411 Generalized anxiety disorder: Secondary | ICD-10-CM | POA: Diagnosis not present

## 2022-08-29 DIAGNOSIS — F5105 Insomnia due to other mental disorder: Secondary | ICD-10-CM | POA: Diagnosis not present

## 2022-08-29 DIAGNOSIS — F3342 Major depressive disorder, recurrent, in full remission: Secondary | ICD-10-CM | POA: Diagnosis not present

## 2022-08-29 DIAGNOSIS — F988 Other specified behavioral and emotional disorders with onset usually occurring in childhood and adolescence: Secondary | ICD-10-CM | POA: Diagnosis not present

## 2022-08-29 MED ORDER — AMPHETAMINE SULFATE 10 MG PO TABS
ORAL_TABLET | ORAL | 0 refills | Status: DC
  Filled 2022-08-29: qty 60, 30d supply, fill #0

## 2022-08-29 MED ORDER — ATOMOXETINE HCL 60 MG PO CAPS
ORAL_CAPSULE | ORAL | 11 refills | Status: DC
  Filled 2022-08-29: qty 30, 30d supply, fill #0
  Filled 2022-09-28: qty 30, 30d supply, fill #1
  Filled 2022-10-24: qty 30, 30d supply, fill #2
  Filled 2022-11-24: qty 30, 30d supply, fill #3
  Filled 2022-12-20: qty 30, 30d supply, fill #4
  Filled 2023-01-17: qty 30, 30d supply, fill #5
  Filled 2023-02-19: qty 30, 30d supply, fill #6
  Filled 2023-03-19: qty 30, 30d supply, fill #7
  Filled 2023-04-18: qty 30, 30d supply, fill #8
  Filled 2023-05-22: qty 30, 30d supply, fill #9
  Filled 2023-06-18: qty 30, 30d supply, fill #10
  Filled 2023-07-16: qty 30, 30d supply, fill #11

## 2022-08-29 MED ORDER — MIRTAZAPINE 7.5 MG PO TABS
11.2500 mg | ORAL_TABLET | Freq: Every evening | ORAL | 5 refills | Status: AC
  Filled 2022-09-08: qty 45, 30d supply, fill #0
  Filled 2022-10-05: qty 45, 30d supply, fill #1
  Filled 2022-11-15: qty 45, 30d supply, fill #2
  Filled 2022-12-20: qty 45, 30d supply, fill #3
  Filled 2023-02-14: qty 45, 30d supply, fill #4
  Filled 2023-03-19: qty 45, 30d supply, fill #5

## 2022-08-29 MED ORDER — TRINTELLIX 20 MG PO TABS
20.0000 mg | ORAL_TABLET | Freq: Every day | ORAL | 5 refills | Status: AC
  Filled 2022-10-20: qty 30, 30d supply, fill #0
  Filled 2022-11-15: qty 30, 30d supply, fill #1
  Filled 2022-12-20: qty 30, 30d supply, fill #2
  Filled 2023-01-17: qty 30, 30d supply, fill #3
  Filled 2023-02-14: qty 30, 30d supply, fill #4
  Filled 2023-03-19: qty 30, 30d supply, fill #5

## 2022-08-31 DIAGNOSIS — Z79899 Other long term (current) drug therapy: Secondary | ICD-10-CM | POA: Diagnosis not present

## 2022-08-31 DIAGNOSIS — F902 Attention-deficit hyperactivity disorder, combined type: Secondary | ICD-10-CM | POA: Diagnosis not present

## 2022-09-08 ENCOUNTER — Other Ambulatory Visit (HOSPITAL_BASED_OUTPATIENT_CLINIC_OR_DEPARTMENT_OTHER): Payer: Self-pay

## 2022-09-13 DIAGNOSIS — F419 Anxiety disorder, unspecified: Secondary | ICD-10-CM | POA: Diagnosis not present

## 2022-09-18 ENCOUNTER — Other Ambulatory Visit (HOSPITAL_BASED_OUTPATIENT_CLINIC_OR_DEPARTMENT_OTHER): Payer: Self-pay

## 2022-09-28 ENCOUNTER — Other Ambulatory Visit (HOSPITAL_BASED_OUTPATIENT_CLINIC_OR_DEPARTMENT_OTHER): Payer: Self-pay

## 2022-09-28 MED ORDER — AMPHETAMINE SULFATE 10 MG PO TABS
ORAL_TABLET | ORAL | 0 refills | Status: DC
  Filled 2022-09-28: qty 60, 30d supply, fill #0

## 2022-10-02 ENCOUNTER — Other Ambulatory Visit: Payer: Self-pay | Admitting: *Deleted

## 2022-10-02 DIAGNOSIS — Z1231 Encounter for screening mammogram for malignant neoplasm of breast: Secondary | ICD-10-CM

## 2022-10-05 ENCOUNTER — Other Ambulatory Visit (HOSPITAL_BASED_OUTPATIENT_CLINIC_OR_DEPARTMENT_OTHER): Payer: Self-pay

## 2022-10-19 ENCOUNTER — Other Ambulatory Visit (HOSPITAL_BASED_OUTPATIENT_CLINIC_OR_DEPARTMENT_OTHER): Payer: Self-pay

## 2022-10-20 ENCOUNTER — Other Ambulatory Visit (HOSPITAL_BASED_OUTPATIENT_CLINIC_OR_DEPARTMENT_OTHER): Payer: Self-pay

## 2022-10-25 ENCOUNTER — Other Ambulatory Visit (HOSPITAL_BASED_OUTPATIENT_CLINIC_OR_DEPARTMENT_OTHER): Payer: Self-pay

## 2022-10-25 ENCOUNTER — Ambulatory Visit (INDEPENDENT_AMBULATORY_CARE_PROVIDER_SITE_OTHER): Payer: 59

## 2022-10-25 DIAGNOSIS — Z1231 Encounter for screening mammogram for malignant neoplasm of breast: Secondary | ICD-10-CM

## 2022-10-25 MED ORDER — AMPHETAMINE SULFATE 10 MG PO TABS
10.0000 mg | ORAL_TABLET | Freq: Two times a day (BID) | ORAL | 0 refills | Status: DC
  Filled 2022-10-25 – 2022-10-26 (×2): qty 60, 30d supply, fill #0

## 2022-10-26 ENCOUNTER — Other Ambulatory Visit (HOSPITAL_BASED_OUTPATIENT_CLINIC_OR_DEPARTMENT_OTHER): Payer: Self-pay

## 2022-11-15 ENCOUNTER — Other Ambulatory Visit (HOSPITAL_BASED_OUTPATIENT_CLINIC_OR_DEPARTMENT_OTHER): Payer: Self-pay

## 2022-11-24 ENCOUNTER — Other Ambulatory Visit (HOSPITAL_BASED_OUTPATIENT_CLINIC_OR_DEPARTMENT_OTHER): Payer: Self-pay

## 2022-11-24 MED ORDER — AMPHETAMINE SULFATE 10 MG PO TABS
10.0000 mg | ORAL_TABLET | Freq: Two times a day (BID) | ORAL | 0 refills | Status: DC
  Filled 2022-11-24: qty 60, 30d supply, fill #0

## 2022-11-29 DIAGNOSIS — F419 Anxiety disorder, unspecified: Secondary | ICD-10-CM | POA: Diagnosis not present

## 2022-12-21 ENCOUNTER — Other Ambulatory Visit (HOSPITAL_BASED_OUTPATIENT_CLINIC_OR_DEPARTMENT_OTHER): Payer: Self-pay

## 2022-12-21 MED ORDER — AMPHETAMINE SULFATE 10 MG PO TABS
10.0000 mg | ORAL_TABLET | Freq: Two times a day (BID) | ORAL | 0 refills | Status: DC
  Filled 2022-12-21 – 2022-12-22 (×2): qty 60, 30d supply, fill #0

## 2022-12-22 ENCOUNTER — Other Ambulatory Visit (HOSPITAL_BASED_OUTPATIENT_CLINIC_OR_DEPARTMENT_OTHER): Payer: Self-pay

## 2022-12-26 ENCOUNTER — Other Ambulatory Visit (HOSPITAL_BASED_OUTPATIENT_CLINIC_OR_DEPARTMENT_OTHER): Payer: Self-pay

## 2022-12-26 DIAGNOSIS — F411 Generalized anxiety disorder: Secondary | ICD-10-CM | POA: Diagnosis not present

## 2022-12-26 DIAGNOSIS — F3342 Major depressive disorder, recurrent, in full remission: Secondary | ICD-10-CM | POA: Diagnosis not present

## 2022-12-26 DIAGNOSIS — F988 Other specified behavioral and emotional disorders with onset usually occurring in childhood and adolescence: Secondary | ICD-10-CM | POA: Diagnosis not present

## 2022-12-26 DIAGNOSIS — F5105 Insomnia due to other mental disorder: Secondary | ICD-10-CM | POA: Diagnosis not present

## 2022-12-26 MED ORDER — TRINTELLIX 20 MG PO TABS
20.0000 mg | ORAL_TABLET | Freq: Every day | ORAL | 11 refills | Status: DC
  Filled 2022-12-26 – 2023-04-17 (×2): qty 30, 30d supply, fill #0
  Filled 2023-05-16: qty 30, 30d supply, fill #1
  Filled 2023-06-13: qty 30, 30d supply, fill #2
  Filled 2023-07-16: qty 30, 30d supply, fill #3
  Filled 2023-08-15: qty 30, 30d supply, fill #4
  Filled 2023-09-13: qty 30, 30d supply, fill #5
  Filled 2023-10-16: qty 30, 30d supply, fill #6
  Filled 2023-11-14: qty 30, 30d supply, fill #7
  Filled 2023-12-12: qty 30, 30d supply, fill #8

## 2022-12-26 MED ORDER — MIRTAZAPINE 7.5 MG PO TABS
7.5000 mg | ORAL_TABLET | Freq: Every day | ORAL | 5 refills | Status: DC
  Filled 2022-12-26 – 2023-05-14 (×2): qty 30, 30d supply, fill #0
  Filled 2023-06-13: qty 30, 30d supply, fill #1
  Filled 2023-07-16: qty 30, 30d supply, fill #2
  Filled 2023-08-15: qty 30, 30d supply, fill #3
  Filled 2023-09-13: qty 30, 30d supply, fill #4
  Filled 2023-10-16: qty 30, 30d supply, fill #5

## 2023-01-17 ENCOUNTER — Other Ambulatory Visit (HOSPITAL_BASED_OUTPATIENT_CLINIC_OR_DEPARTMENT_OTHER): Payer: Self-pay

## 2023-01-21 DIAGNOSIS — J019 Acute sinusitis, unspecified: Secondary | ICD-10-CM | POA: Diagnosis not present

## 2023-01-21 DIAGNOSIS — R0981 Nasal congestion: Secondary | ICD-10-CM | POA: Diagnosis not present

## 2023-01-21 DIAGNOSIS — J029 Acute pharyngitis, unspecified: Secondary | ICD-10-CM | POA: Diagnosis not present

## 2023-01-21 DIAGNOSIS — R509 Fever, unspecified: Secondary | ICD-10-CM | POA: Diagnosis not present

## 2023-01-21 DIAGNOSIS — R519 Headache, unspecified: Secondary | ICD-10-CM | POA: Diagnosis not present

## 2023-01-21 DIAGNOSIS — B9689 Other specified bacterial agents as the cause of diseases classified elsewhere: Secondary | ICD-10-CM | POA: Diagnosis not present

## 2023-01-24 ENCOUNTER — Other Ambulatory Visit (HOSPITAL_BASED_OUTPATIENT_CLINIC_OR_DEPARTMENT_OTHER): Payer: Self-pay

## 2023-01-24 MED ORDER — AMPHETAMINE SULFATE 10 MG PO TABS
10.0000 mg | ORAL_TABLET | Freq: Two times a day (BID) | ORAL | 0 refills | Status: DC
  Filled 2023-01-24: qty 60, 30d supply, fill #0

## 2023-02-19 ENCOUNTER — Other Ambulatory Visit (HOSPITAL_BASED_OUTPATIENT_CLINIC_OR_DEPARTMENT_OTHER): Payer: Self-pay

## 2023-02-20 ENCOUNTER — Other Ambulatory Visit (HOSPITAL_BASED_OUTPATIENT_CLINIC_OR_DEPARTMENT_OTHER): Payer: Self-pay

## 2023-02-20 MED ORDER — AMPHETAMINE SULFATE 10 MG PO TABS
10.0000 mg | ORAL_TABLET | Freq: Two times a day (BID) | ORAL | 0 refills | Status: DC
  Filled 2023-02-20 – 2023-02-22 (×2): qty 60, 30d supply, fill #0

## 2023-02-22 ENCOUNTER — Other Ambulatory Visit (HOSPITAL_BASED_OUTPATIENT_CLINIC_OR_DEPARTMENT_OTHER): Payer: Self-pay

## 2023-02-26 DIAGNOSIS — Z5181 Encounter for therapeutic drug level monitoring: Secondary | ICD-10-CM | POA: Diagnosis not present

## 2023-02-26 DIAGNOSIS — F902 Attention-deficit hyperactivity disorder, combined type: Secondary | ICD-10-CM | POA: Diagnosis not present

## 2023-02-26 DIAGNOSIS — Z79899 Other long term (current) drug therapy: Secondary | ICD-10-CM | POA: Diagnosis not present

## 2023-02-26 DIAGNOSIS — Z79891 Long term (current) use of opiate analgesic: Secondary | ICD-10-CM | POA: Diagnosis not present

## 2023-02-27 ENCOUNTER — Other Ambulatory Visit (HOSPITAL_BASED_OUTPATIENT_CLINIC_OR_DEPARTMENT_OTHER): Payer: Self-pay

## 2023-02-27 MED ORDER — ATOMOXETINE HCL 60 MG PO CAPS
60.0000 mg | ORAL_CAPSULE | Freq: Every day | ORAL | 11 refills | Status: AC
  Filled 2023-02-27: qty 30, 30d supply, fill #0

## 2023-03-08 ENCOUNTER — Other Ambulatory Visit (HOSPITAL_BASED_OUTPATIENT_CLINIC_OR_DEPARTMENT_OTHER): Payer: Self-pay

## 2023-03-19 ENCOUNTER — Other Ambulatory Visit (HOSPITAL_BASED_OUTPATIENT_CLINIC_OR_DEPARTMENT_OTHER): Payer: Self-pay

## 2023-03-26 ENCOUNTER — Other Ambulatory Visit (HOSPITAL_BASED_OUTPATIENT_CLINIC_OR_DEPARTMENT_OTHER): Payer: Self-pay

## 2023-03-26 MED ORDER — AMPHETAMINE SULFATE 10 MG PO TABS
10.0000 mg | ORAL_TABLET | Freq: Two times a day (BID) | ORAL | 0 refills | Status: DC
  Filled 2023-03-26: qty 60, 30d supply, fill #0

## 2023-04-09 ENCOUNTER — Other Ambulatory Visit (HOSPITAL_BASED_OUTPATIENT_CLINIC_OR_DEPARTMENT_OTHER): Payer: Self-pay

## 2023-04-17 ENCOUNTER — Other Ambulatory Visit (HOSPITAL_BASED_OUTPATIENT_CLINIC_OR_DEPARTMENT_OTHER): Payer: Self-pay

## 2023-04-18 ENCOUNTER — Other Ambulatory Visit (HOSPITAL_BASED_OUTPATIENT_CLINIC_OR_DEPARTMENT_OTHER): Payer: Self-pay

## 2023-04-18 MED ORDER — AMPHETAMINE SULFATE 10 MG PO TABS
10.0000 mg | ORAL_TABLET | Freq: Two times a day (BID) | ORAL | 0 refills | Status: DC
  Filled 2023-04-18 – 2023-04-24 (×2): qty 60, 30d supply, fill #0

## 2023-04-24 ENCOUNTER — Other Ambulatory Visit: Payer: Self-pay

## 2023-04-24 ENCOUNTER — Other Ambulatory Visit (HOSPITAL_BASED_OUTPATIENT_CLINIC_OR_DEPARTMENT_OTHER): Payer: Self-pay

## 2023-05-14 ENCOUNTER — Other Ambulatory Visit (HOSPITAL_BASED_OUTPATIENT_CLINIC_OR_DEPARTMENT_OTHER): Payer: Self-pay

## 2023-05-22 ENCOUNTER — Other Ambulatory Visit (HOSPITAL_BASED_OUTPATIENT_CLINIC_OR_DEPARTMENT_OTHER): Payer: Self-pay

## 2023-05-22 MED ORDER — AMPHETAMINE SULFATE 10 MG PO TABS
10.0000 mg | ORAL_TABLET | Freq: Two times a day (BID) | ORAL | 0 refills | Status: DC
  Filled 2023-05-22: qty 60, 30d supply, fill #0

## 2023-06-13 ENCOUNTER — Other Ambulatory Visit (HOSPITAL_BASED_OUTPATIENT_CLINIC_OR_DEPARTMENT_OTHER): Payer: Self-pay

## 2023-06-14 ENCOUNTER — Other Ambulatory Visit (HOSPITAL_BASED_OUTPATIENT_CLINIC_OR_DEPARTMENT_OTHER): Payer: Self-pay

## 2023-06-19 ENCOUNTER — Other Ambulatory Visit (HOSPITAL_BASED_OUTPATIENT_CLINIC_OR_DEPARTMENT_OTHER): Payer: Self-pay

## 2023-06-19 ENCOUNTER — Other Ambulatory Visit: Payer: Self-pay

## 2023-06-19 MED ORDER — AMPHETAMINE SULFATE 10 MG PO TABS
10.0000 mg | ORAL_TABLET | Freq: Two times a day (BID) | ORAL | 0 refills | Status: DC
  Filled 2023-06-19: qty 60, 30d supply, fill #0

## 2023-07-16 ENCOUNTER — Other Ambulatory Visit (HOSPITAL_BASED_OUTPATIENT_CLINIC_OR_DEPARTMENT_OTHER): Payer: Self-pay

## 2023-07-17 ENCOUNTER — Other Ambulatory Visit (HOSPITAL_BASED_OUTPATIENT_CLINIC_OR_DEPARTMENT_OTHER): Payer: Self-pay

## 2023-07-17 MED ORDER — AMPHETAMINE SULFATE 10 MG PO TABS
10.0000 mg | ORAL_TABLET | Freq: Two times a day (BID) | ORAL | 0 refills | Status: DC
  Filled 2023-07-17: qty 60, 30d supply, fill #0

## 2023-08-15 ENCOUNTER — Other Ambulatory Visit (HOSPITAL_BASED_OUTPATIENT_CLINIC_OR_DEPARTMENT_OTHER): Payer: Self-pay

## 2023-08-15 MED ORDER — AMPHETAMINE SULFATE 10 MG PO TABS
10.0000 mg | ORAL_TABLET | Freq: Two times a day (BID) | ORAL | 0 refills | Status: DC
  Filled 2023-08-15: qty 60, 30d supply, fill #0

## 2023-08-16 ENCOUNTER — Other Ambulatory Visit (HOSPITAL_BASED_OUTPATIENT_CLINIC_OR_DEPARTMENT_OTHER): Payer: Self-pay

## 2023-08-17 ENCOUNTER — Other Ambulatory Visit (HOSPITAL_BASED_OUTPATIENT_CLINIC_OR_DEPARTMENT_OTHER): Payer: Self-pay

## 2023-08-17 MED ORDER — ATOMOXETINE HCL 60 MG PO CAPS
60.0000 mg | ORAL_CAPSULE | Freq: Every day | ORAL | 0 refills | Status: DC
  Filled 2023-08-17: qty 30, 30d supply, fill #0

## 2023-09-20 ENCOUNTER — Other Ambulatory Visit (HOSPITAL_BASED_OUTPATIENT_CLINIC_OR_DEPARTMENT_OTHER): Payer: Self-pay

## 2023-09-20 MED ORDER — ATOMOXETINE HCL 60 MG PO CAPS
60.0000 mg | ORAL_CAPSULE | Freq: Every day | ORAL | 11 refills | Status: DC
  Filled 2023-09-20: qty 30, 30d supply, fill #0
  Filled 2023-10-16: qty 30, 30d supply, fill #1
  Filled 2023-11-14: qty 30, 30d supply, fill #2
  Filled 2023-12-12: qty 30, 30d supply, fill #3
  Filled 2024-01-14: qty 30, 30d supply, fill #4
  Filled 2024-02-12: qty 30, 30d supply, fill #5
  Filled 2024-03-14: qty 30, 30d supply, fill #6
  Filled 2024-05-11: qty 30, 30d supply, fill #7
  Filled 2024-06-11: qty 30, 30d supply, fill #8
  Filled 2024-07-09: qty 30, 30d supply, fill #9
  Filled 2024-08-08: qty 30, 30d supply, fill #10
  Filled 2024-09-06: qty 30, 30d supply, fill #11

## 2023-09-20 MED ORDER — AMPHETAMINE SULFATE 10 MG PO TABS
10.0000 mg | ORAL_TABLET | Freq: Two times a day (BID) | ORAL | 0 refills | Status: DC
  Filled 2023-09-20: qty 60, 30d supply, fill #0

## 2023-09-21 ENCOUNTER — Other Ambulatory Visit (HOSPITAL_BASED_OUTPATIENT_CLINIC_OR_DEPARTMENT_OTHER): Payer: Self-pay

## 2023-09-21 MED ORDER — ATOMOXETINE HCL 60 MG PO CAPS: 60.0000 mg | ORAL_CAPSULE | Freq: Every day | ORAL | 11 refills | Status: AC

## 2023-10-08 ENCOUNTER — Other Ambulatory Visit: Payer: Self-pay | Admitting: *Deleted

## 2023-10-08 ENCOUNTER — Other Ambulatory Visit (HOSPITAL_BASED_OUTPATIENT_CLINIC_OR_DEPARTMENT_OTHER): Payer: Self-pay

## 2023-10-08 DIAGNOSIS — Z1231 Encounter for screening mammogram for malignant neoplasm of breast: Secondary | ICD-10-CM

## 2023-10-08 MED ORDER — KETOCONAZOLE 2 % EX CREA
1.0000 | TOPICAL_CREAM | Freq: Every day | CUTANEOUS | 99 refills | Status: AC
  Filled 2023-10-08: qty 60, 30d supply, fill #0

## 2023-10-08 MED ORDER — CLOBETASOL PROPIONATE 0.05 % EX CREA
1.0000 | TOPICAL_CREAM | Freq: Two times a day (BID) | CUTANEOUS | 5 refills | Status: AC
  Filled 2023-10-08: qty 60, 30d supply, fill #0

## 2023-10-17 ENCOUNTER — Other Ambulatory Visit (HOSPITAL_BASED_OUTPATIENT_CLINIC_OR_DEPARTMENT_OTHER): Payer: Self-pay

## 2023-10-17 MED ORDER — AMPHETAMINE SULFATE 10 MG PO TABS
10.0000 mg | ORAL_TABLET | Freq: Two times a day (BID) | ORAL | 0 refills | Status: DC
  Filled 2023-10-17 – 2023-10-18 (×2): qty 60, 30d supply, fill #0

## 2023-10-18 ENCOUNTER — Other Ambulatory Visit (HOSPITAL_BASED_OUTPATIENT_CLINIC_OR_DEPARTMENT_OTHER): Payer: Self-pay

## 2023-10-22 ENCOUNTER — Other Ambulatory Visit (HOSPITAL_BASED_OUTPATIENT_CLINIC_OR_DEPARTMENT_OTHER): Payer: Self-pay

## 2023-10-22 MED ORDER — MIRTAZAPINE 7.5 MG PO TABS
7.5000 mg | ORAL_TABLET | Freq: Every day | ORAL | 11 refills | Status: DC
  Filled 2023-10-22 – 2023-11-14 (×2): qty 30, 30d supply, fill #0
  Filled 2023-12-12: qty 30, 30d supply, fill #1
  Filled 2024-01-14: qty 30, 30d supply, fill #2
  Filled 2024-02-12: qty 30, 30d supply, fill #3
  Filled 2024-03-14: qty 30, 30d supply, fill #4
  Filled 2024-04-10: qty 30, 30d supply, fill #5
  Filled 2024-05-11: qty 30, 30d supply, fill #6
  Filled 2024-06-11: qty 30, 30d supply, fill #7
  Filled 2024-07-09: qty 30, 30d supply, fill #8
  Filled 2024-08-08: qty 30, 30d supply, fill #9
  Filled 2024-09-06: qty 30, 30d supply, fill #10
  Filled 2024-10-08 (×3): qty 30, 30d supply, fill #11

## 2023-11-14 ENCOUNTER — Other Ambulatory Visit: Payer: Self-pay

## 2023-11-14 ENCOUNTER — Other Ambulatory Visit (HOSPITAL_BASED_OUTPATIENT_CLINIC_OR_DEPARTMENT_OTHER): Payer: Self-pay

## 2023-11-14 ENCOUNTER — Ambulatory Visit: Payer: Managed Care, Other (non HMO)

## 2023-11-14 DIAGNOSIS — Z1231 Encounter for screening mammogram for malignant neoplasm of breast: Secondary | ICD-10-CM | POA: Diagnosis not present

## 2023-11-14 MED ORDER — AMPHETAMINE SULFATE 10 MG PO TABS
10.0000 mg | ORAL_TABLET | Freq: Two times a day (BID) | ORAL | 0 refills | Status: DC
  Filled 2023-11-16: qty 60, 30d supply, fill #0

## 2023-11-15 ENCOUNTER — Other Ambulatory Visit (HOSPITAL_BASED_OUTPATIENT_CLINIC_OR_DEPARTMENT_OTHER): Payer: Self-pay

## 2023-11-16 ENCOUNTER — Other Ambulatory Visit (HOSPITAL_BASED_OUTPATIENT_CLINIC_OR_DEPARTMENT_OTHER): Payer: Self-pay

## 2023-12-12 ENCOUNTER — Other Ambulatory Visit: Payer: Self-pay

## 2023-12-13 ENCOUNTER — Other Ambulatory Visit (HOSPITAL_BASED_OUTPATIENT_CLINIC_OR_DEPARTMENT_OTHER): Payer: Self-pay

## 2023-12-13 MED ORDER — AMPHETAMINE SULFATE 10 MG PO TABS
10.0000 mg | ORAL_TABLET | Freq: Two times a day (BID) | ORAL | 0 refills | Status: DC
  Filled 2023-12-14 – 2023-12-18 (×2): qty 60, 30d supply, fill #0
  Filled ????-??-??: fill #0

## 2023-12-14 ENCOUNTER — Other Ambulatory Visit (HOSPITAL_BASED_OUTPATIENT_CLINIC_OR_DEPARTMENT_OTHER): Payer: Self-pay

## 2023-12-18 ENCOUNTER — Other Ambulatory Visit (HOSPITAL_BASED_OUTPATIENT_CLINIC_OR_DEPARTMENT_OTHER): Payer: Self-pay

## 2023-12-18 ENCOUNTER — Other Ambulatory Visit: Payer: Self-pay

## 2024-01-14 ENCOUNTER — Other Ambulatory Visit (HOSPITAL_BASED_OUTPATIENT_CLINIC_OR_DEPARTMENT_OTHER): Payer: Self-pay

## 2024-01-15 ENCOUNTER — Other Ambulatory Visit (HOSPITAL_BASED_OUTPATIENT_CLINIC_OR_DEPARTMENT_OTHER): Payer: Self-pay

## 2024-01-15 MED ORDER — TRINTELLIX 20 MG PO TABS
20.0000 mg | ORAL_TABLET | Freq: Every day | ORAL | 0 refills | Status: DC
  Filled 2024-01-15: qty 30, 30d supply, fill #0

## 2024-01-15 MED ORDER — AMPHETAMINE SULFATE 10 MG PO TABS
1.0000 | ORAL_TABLET | Freq: Two times a day (BID) | ORAL | 0 refills | Status: DC
  Filled 2024-01-15 – 2024-01-18 (×2): qty 60, 30d supply, fill #0

## 2024-01-18 ENCOUNTER — Other Ambulatory Visit (HOSPITAL_BASED_OUTPATIENT_CLINIC_OR_DEPARTMENT_OTHER): Payer: Self-pay

## 2024-01-21 ENCOUNTER — Other Ambulatory Visit (HOSPITAL_BASED_OUTPATIENT_CLINIC_OR_DEPARTMENT_OTHER): Payer: Self-pay

## 2024-01-21 MED ORDER — TRINTELLIX 20 MG PO TABS
20.0000 mg | ORAL_TABLET | Freq: Every day | ORAL | 11 refills | Status: AC
  Filled 2024-01-21 – 2024-02-12 (×2): qty 30, 30d supply, fill #0
  Filled 2024-03-14: qty 30, 30d supply, fill #1
  Filled 2024-04-10: qty 30, 30d supply, fill #2
  Filled 2024-05-11: qty 30, 30d supply, fill #3
  Filled 2024-06-11: qty 30, 30d supply, fill #4
  Filled 2024-07-09: qty 30, 30d supply, fill #5
  Filled 2024-08-08: qty 30, 30d supply, fill #6
  Filled 2024-09-06: qty 30, 30d supply, fill #7
  Filled 2024-10-08 (×3): qty 30, 30d supply, fill #8
  Filled 2024-11-10: qty 30, 30d supply, fill #9
  Filled 2024-12-08: qty 30, 30d supply, fill #10
  Filled 2025-01-05: qty 30, 30d supply, fill #11

## 2024-02-12 ENCOUNTER — Other Ambulatory Visit (HOSPITAL_BASED_OUTPATIENT_CLINIC_OR_DEPARTMENT_OTHER): Payer: Self-pay

## 2024-02-12 ENCOUNTER — Other Ambulatory Visit: Payer: Self-pay

## 2024-02-15 ENCOUNTER — Other Ambulatory Visit (HOSPITAL_BASED_OUTPATIENT_CLINIC_OR_DEPARTMENT_OTHER): Payer: Self-pay

## 2024-02-15 MED ORDER — AMPHETAMINE SULFATE 10 MG PO TABS
10.0000 mg | ORAL_TABLET | Freq: Two times a day (BID) | ORAL | 0 refills | Status: DC
  Filled 2024-02-15: qty 60, 30d supply, fill #0

## 2024-02-27 ENCOUNTER — Other Ambulatory Visit (HOSPITAL_BASED_OUTPATIENT_CLINIC_OR_DEPARTMENT_OTHER): Payer: Self-pay

## 2024-02-27 MED ORDER — JORNAY PM 60 MG PO CP24
1.0000 | ORAL_CAPSULE | Freq: Every day | ORAL | 0 refills | Status: AC
  Filled 2024-02-27: qty 30, 30d supply, fill #0

## 2024-03-03 ENCOUNTER — Other Ambulatory Visit (HOSPITAL_BASED_OUTPATIENT_CLINIC_OR_DEPARTMENT_OTHER): Payer: Self-pay

## 2024-03-14 ENCOUNTER — Other Ambulatory Visit: Payer: Self-pay

## 2024-03-14 ENCOUNTER — Other Ambulatory Visit (HOSPITAL_BASED_OUTPATIENT_CLINIC_OR_DEPARTMENT_OTHER): Payer: Self-pay

## 2024-03-14 MED ORDER — AMPHETAMINE SULFATE 10 MG PO TABS
1.0000 | ORAL_TABLET | Freq: Two times a day (BID) | ORAL | 0 refills | Status: DC
  Filled 2024-03-14: qty 60, 30d supply, fill #0

## 2024-04-02 ENCOUNTER — Encounter: Payer: Self-pay | Admitting: Gastroenterology

## 2024-04-10 ENCOUNTER — Other Ambulatory Visit (HOSPITAL_BASED_OUTPATIENT_CLINIC_OR_DEPARTMENT_OTHER): Payer: Self-pay

## 2024-04-10 MED ORDER — AZITHROMYCIN 250 MG PO TABS
ORAL_TABLET | ORAL | 0 refills | Status: AC
  Filled 2024-04-10: qty 6, 5d supply, fill #0

## 2024-04-21 ENCOUNTER — Other Ambulatory Visit (HOSPITAL_BASED_OUTPATIENT_CLINIC_OR_DEPARTMENT_OTHER): Payer: Self-pay

## 2024-04-21 MED ORDER — ALPRAZOLAM 0.25 MG PO TABS
0.1250 mg | ORAL_TABLET | Freq: Every day | ORAL | 0 refills | Status: DC | PRN
Start: 2024-04-21 — End: 2024-04-28
  Filled 2024-04-21: qty 2, 2d supply, fill #0

## 2024-04-22 ENCOUNTER — Other Ambulatory Visit (HOSPITAL_BASED_OUTPATIENT_CLINIC_OR_DEPARTMENT_OTHER): Payer: Self-pay

## 2024-04-22 MED ORDER — AMPHETAMINE SULFATE 10 MG PO TABS
1.0000 | ORAL_TABLET | Freq: Two times a day (BID) | ORAL | 0 refills | Status: DC
  Filled 2024-04-22: qty 60, 30d supply, fill #0

## 2024-04-28 ENCOUNTER — Other Ambulatory Visit (HOSPITAL_BASED_OUTPATIENT_CLINIC_OR_DEPARTMENT_OTHER): Payer: Self-pay

## 2024-04-28 MED ORDER — ALPRAZOLAM 0.25 MG PO TABS
0.1250 mg | ORAL_TABLET | Freq: Every day | ORAL | 0 refills | Status: AC | PRN
  Filled 2024-04-28: qty 15, 15d supply, fill #0

## 2024-04-28 MED ORDER — HYOSCYAMINE SULFATE 0.125 MG SL SUBL
0.1250 mg | SUBLINGUAL_TABLET | SUBLINGUAL | 0 refills | Status: AC | PRN
  Filled 2024-04-28: qty 15, 3d supply, fill #0

## 2024-05-09 ENCOUNTER — Ambulatory Visit: Admitting: Gastroenterology

## 2024-06-11 ENCOUNTER — Other Ambulatory Visit (HOSPITAL_BASED_OUTPATIENT_CLINIC_OR_DEPARTMENT_OTHER): Payer: Self-pay

## 2024-06-16 ENCOUNTER — Other Ambulatory Visit (HOSPITAL_BASED_OUTPATIENT_CLINIC_OR_DEPARTMENT_OTHER): Payer: Self-pay

## 2024-06-16 MED ORDER — AMPHETAMINE SULFATE 10 MG PO TABS
1.0000 | ORAL_TABLET | Freq: Two times a day (BID) | ORAL | 0 refills | Status: AC
  Filled 2024-06-16: qty 60, 30d supply, fill #0

## 2024-06-30 ENCOUNTER — Ambulatory Visit: Admitting: Gastroenterology

## 2024-06-30 ENCOUNTER — Encounter: Payer: Self-pay | Admitting: Gastroenterology

## 2024-06-30 VITALS — BP 102/60 | HR 87 | Ht 63.0 in | Wt 118.0 lb

## 2024-06-30 DIAGNOSIS — Z1211 Encounter for screening for malignant neoplasm of colon: Secondary | ICD-10-CM

## 2024-06-30 DIAGNOSIS — R140 Abdominal distension (gaseous): Secondary | ICD-10-CM

## 2024-06-30 DIAGNOSIS — K581 Irritable bowel syndrome with constipation: Secondary | ICD-10-CM

## 2024-06-30 NOTE — Progress Notes (Signed)
 Discussed the use of AI scribe software for clinical note transcription with the patient, who gave verbal consent to proceed.  HPI : Beth Hernandez is a 53 y.o. female with anxiety, depression and constipation predominant IBS who is referred to us  by Beth Suzen Gunner, MD.   She has a history of constipation-predominant irritable bowel syndrome (IBS) and last saw a gastroenterologist in January 2016 for a colonoscopy. Since then, she has managed her symptoms independently without regular GI care. Her GI symptoms been stable, and she has learned to manage her symptoms primarily through dietary adjustments, such as increasing water intake.  She has not needed to take hyoscyamine  for a long time but recently resumed it due to cramping, possibly related to travel. She currently has daily bowel movements without significant straining, and her stools are usually soft. However, she experiences 'sludgy' stools during high-stress periods, which improve with the reintroduction of probiotics.  Occasionally, she experiences hemorrhoid symptoms, particularly when consuming red meat, which makes bowel movements harder. She sometimes feels unable to fully empty her bowels, leading to straining and discomfort. She describes feeling protrusions on the outside of the anal area. No blood in her stool, but she notes changes in cleanliness after wiping, which improved with a change in toilet paper.  Her current medications include MiraLAX and Colace, which she takes regularly. She has not used fiber supplements. She experiences bloating and discomfort due to gas, which she manages by wearing elastic clothing for comfort. She has a history of difficulty with colonoscopy  preparation, requiring additional Dulcolax to complete the process.  Her maternal grandmother had colon cancer, which is a concern for her, but she does not have a first-degree relative with the condition. She is due for her next colonoscopy in January 2026, ten years after her last one.      Colonoscopy January 2016 (Dr. Obie) Indication: Screening Mild diverticulosis in the ascending colon, otherwise normal Repeat 10 years    Past Medical History:  Diagnosis Date   Anal fissure 2010   Anxiety    Cystitis, interstitial    Depression    IBS (irritable bowel syndrome)    Insomnia    Internal hemorrhoids    Perimenopause      Past Surgical History:  Procedure Laterality Date   BREAST BIOPSY Right    2016   DENTAL SURGERY     skin grafts to gums    DILATION AND CURETTAGE OF UTERUS     FISSURECTOMY     with sphincterotomy   SPHINCTEROTOMY     Family History  Problem Relation Age of Onset   Colon polyps Mother    Alcohol abuse Father    Irritable bowel syndrome Father    Other Father        iliac disease   Breast cancer Sister 1   Lung cancer Maternal Aunt    Diabetes Maternal Aunt    Breast cancer Paternal Aunt        had Br ca x2   Colon cancer Maternal Grandmother    Esophageal cancer Maternal Grandfather    Breast cancer Paternal Grandmother    Rectal cancer Neg Hx    Stomach cancer Neg Hx    Social History   Tobacco Use   Smoking status: Never   Smokeless tobacco: Never  Vaping Use   Vaping status: Never Used  Substance Use Topics   Alcohol use: No    Alcohol/week: 0.0 standard drinks of alcohol   Drug  use: No   Current Outpatient Medications  Medication Sig Dispense Refill   Acetylcysteine 600 MG CAPS Take by mouth.     ALPRAZolam  (XANAX ) 0.25 MG tablet Take 0.25 mg by mouth.     Amphetamine  Sulfate 10 MG TABS Take 1 tablet by mouth twice a day 60 tablet 0   amphetamine -dextroamphetamine (ADDERALL) 10 MG tablet Take 10 mg by mouth daily with  breakfast.     Ascorbic Acid (VITAMIN C) 1000 MG tablet Take 1,000 mg by mouth daily.      atomoxetine  (STRATTERA ) 60 MG capsule Take 1 capsule (60 mg total) by mouth daily. 30 capsule 11   Calcium Glycerophosphate (PRELIEF) 340 (65-50) MG (CA-P) TABS Take 4 tablets by mouth 2 (two) times daily before a meal.      Cholecalciferol (VITAMIN D) 400 UNITS capsule Take 5,000 Int'l Units by mouth daily.      clobetasol  cream (TEMOVATE ) 0.05 % Apply 1 Application topically to arms, legs, etc. 2 (two) times daily for 2 weeks on, then 2 weeks off 60 g 5   docusate sodium (COLACE) 100 MG capsule Take 100 mg by mouth daily.      hyoscyamine  (LEVSIN  SL) 0.125 MG SL tablet Take 1 tablet (0.125 mg total) by mouth every 4 (four) hours as needed for cramping. 15 tablet 0   ketoconazole  (NIZORAL ) 2 % cream Apply 1 Application topically to ears, daily. 60 g PRN   mirtazapine  (REMERON ) 7.5 MG tablet Take one and a half tablets (11.25 mg dose) by mouth at bedtime. 45 tablet 5   Omega-3 Fatty Acids (FISH OIL) 500 MG CAPS Take 1,000 mg by mouth daily.      polyethylene glycol (MIRALAX / GLYCOLAX) packet Take 17 g by mouth daily.      Probiotic Product (ACIDOPHILUS) 90-25 MG CHEW Chew 1 capsule by mouth daily.      vortioxetine  HBr (TRINTELLIX ) 20 MG TABS tablet Take 1 tablet (20 mg total) by mouth daily. 30 tablet 11   Zoster Vaccine Adjuvanted (SHINGRIX ) injection Inject into the muscle,repeat in 2-6 months 1 mL 1   ALPRAZolam  (XANAX ) 0.25 MG tablet Take 0.5-1 tablets (0.125-0.25 mg total) by mouth daily as needed for anxiety. Max daily amount of 0.25mg  (1 tablet) (Patient not taking: Reported on 06/30/2024) 15 tablet 0   atomoxetine  (STRATTERA ) 60 MG capsule TAKE 1 CAPSULE BY MOUTH ONCE DAILY (Patient not taking: Reported on 06/30/2024) 30 capsule 11   atomoxetine  (STRATTERA ) 60 MG capsule Take 1 capsule (60 mg total) by mouth daily. (Patient not taking: Reported on 06/30/2024) 30 capsule 11   atomoxetine  (STRATTERA ) 60 MG  capsule Take 1 capsule (60 mg total) by mouth daily. (Patient not taking: Reported on 06/30/2024) 30 capsule 11   azithromycin  (ZITHROMAX ) 250 MG tablet Take 2 tablets (500 mg total) by mouth on day 1, and then take 1 tablet (250 mg total) daily on days 2  through 5. (Patient not taking: Reported on 06/30/2024) 6 tablet 0   benzonatate  (TESSALON ) 200 MG capsule Take 1 capsule (200 mg total) by mouth 2 (two) times daily as needed for cough. 20 capsule 0   Doxepin  HCl 6 MG TABS TAKE 6 MG TO 9 MG AT BED TIME AS NEEDED FOR SLEEP (Patient not taking: Reported on 06/30/2024) 45 tablet 2   Doxepin  HCl 6 MG TABS Take one tablet (6 mg dose) by mouth at bedtime. (Patient not taking: Reported on 06/30/2024) 30 tablet 11   doxycycline  (VIBRAMYCIN ) 100 MG capsule Take  1 capsule (100 mg total) by mouth 2 (two) times daily. (Patient not taking: Reported on 06/30/2024) 14 capsule 0   hyoscyamine  (LEVSIN  SL) 0.125 MG SL tablet Take 0.125 mg by mouth every 4 (four) hours as needed.  (Patient not taking: Reported on 06/30/2024)     Magnesium Oxide 400 MG CAPS Take 1 tablet by mouth daily.  (Patient not taking: Reported on 06/30/2024)     Methylphenidate  HCl ER, PM, (JORNAY PM ) 60 MG CP24 Take 1 capsule by mouth nightly (Patient not taking: Reported on 06/30/2024) 30 capsule 0   mirtazapine  (REMERON ) 7.5 MG tablet Take 1 tablet (7.5 mg total) by mouth at bedtime. (Patient not taking: Reported on 06/30/2024) 30 tablet 11   Sulfacetamide Sodium-Sulfur 10-5 % LOTN Apply daily to face (Patient not taking: Reported on 06/30/2024)     tobramycin  (TOBREX ) 0.3 % ophthalmic solution Place 1 drop into the right eye every 4 (four) hours. (Patient not taking: Reported on 06/30/2024) 5 mL 0   vortioxetine  HBr (TRINTELLIX ) 20 MG TABS tablet Take one tablet (20 mg dose) by mouth daily. (Patient not taking: Reported on 06/30/2024) 30 tablet 5   vortioxetine  HBr (TRINTELLIX ) 20 MG TABS tablet Take 1 tablet by mouth daily (Patient not taking: Reported on 06/30/2024)  30 tablet 5   vortioxetine  HBr (TRINTELLIX ) 20 MG TABS tablet Take 1 tablet (20 mg total) by mouth daily. (Patient not taking: Reported on 06/30/2024) 30 tablet 5   No current facility-administered medications for this visit.   Allergies  Allergen Reactions   Abilify [Aripiprazole]    Amphetamines    Atrovent Nasal Spray [Ipratropium] Nausea Only   Bacopa Monnieri    Bupropion    Cephalexin     REACTION: hives   Ciprofloxacin     REACTION: mental problems   Metadate  Er [Methylphenidate ]    Penicillins     REACTION: itching and tingling   Macrobid  [Nitrofurantoin Monohyd Macro] Itching and Rash     Review of Systems: All systems reviewed and negative except where noted in HPI.    No results found.  Physical Exam: Ht 5' 3 (1.6 m)   Wt 118 lb (53.5 kg)   LMP 09/19/2014 Comment: irregular  BMI 20.90 kg/m  Constitutional: Pleasant,well-developed, Caucasian female in no acute distress. HEENT: Normocephalic and atraumatic. Conjunctivae are normal. No scleral icterus. Neck supple.  Cardiovascular: Normal rate, regular rhythm.  Pulmonary/chest: Effort normal and breath sounds normal. No wheezing, rales or rhonchi. Abdominal: Soft, nondistended, mild, multifocal tenderness to palpation in the epigastrium, LUQ and LLQ, no rigidity or guarding. Bowel sounds active throughout. There are no masses palpable. No hepatomegaly. Extremities: no edema Lymphadenopathy: No cervical adenopathy noted. Neurological: Alert and oriented to person place and time. Skin: Skin is warm and dry. No rashes noted. Psychiatric: Normal mood and affect. Behavior is normal.  CBC No results found for: WBC, RBC, HGB, HCT, PLT, MCV, MCH, MCHC, RDW, LYMPHSABS, MONOABS, EOSABS, BASOSABS  CMP  No results found for: NA, K, CL, CO2, GLUCOSE, BUN, CREATININE, CALCIUM, PROT, ALBUMIN, AST, ALT, ALKPHOS, BILITOT, GFRNONAA, GFRAA      No data to display             ASSESSMENT AND PLAN:  53 year old female with IBS-C, currently with minimal symptoms, taking MiraLax and colace daily with PRN levsin .  She has had some symptoms of incomplete evacuation and messy bowel movements, which I think would improve with fiber supplementation.  Constipation predominant IBS Symptoms improved with  probiotics. No significant straining or diarrhea.  - Recommend daily fiber supplement like Metamucil. - Continue probiotics and PRN levsin .  Bloating Discussed role of gut flora and diet in bloating/gas.  Recommended eliminating legumes and cruciferous vegetables if bloating is felt to be very bothersome   Colon cancer screening Family history of colon cancer noted (grandparent), but still considered average risk.  Normal colonoscopy 2016. - Schedule colonoscopy for January 2026.  Recording duration: 19 minutes     Odell Choung E. Stacia, MD [facility]    Beth, Suzen Gunner, MD

## 2024-06-30 NOTE — Patient Instructions (Addendum)
 Start taking Metamucil daily  Follow up as needed   If your blood pressure at your visit was 140/90 or greater, please contact your primary care physician to follow up on this.  _______________________________________________________  If you are age 55 or older, your body mass index should be between 23-30. Your Body mass index is 20.9 kg/m. If this is out of the aforementioned range listed, please consider follow up with your Primary Care Provider.  If you are age 32 or younger, your body mass index should be between 19-25. Your Body mass index is 20.9 kg/m. If this is out of the aformentioned range listed, please consider follow up with your Primary Care Provider.   ________________________________________________________  The [facility] providers would like to encourage you to use MYCHART to communicate with providers for non-urgent requests or questions.  Due to long hold times on the telephone, sending your provider a message by [facility] may be a faster and more efficient way to get a response.  Please allow 48 business hours for a response.  Please remember that this is for non-urgent requests.  _______________________________________________________  Thank you for entrusting me with your care and choosing [facility].  Dr Stacia

## 2024-07-10 ENCOUNTER — Other Ambulatory Visit (HOSPITAL_BASED_OUTPATIENT_CLINIC_OR_DEPARTMENT_OTHER): Payer: Self-pay

## 2024-07-10 MED ORDER — LISDEXAMFETAMINE DIMESYLATE 10 MG PO CAPS
10.0000 mg | ORAL_CAPSULE | Freq: Every day | ORAL | 0 refills | Status: DC
  Filled 2024-07-10: qty 30, 30d supply, fill #0

## 2024-07-11 ENCOUNTER — Other Ambulatory Visit (HOSPITAL_BASED_OUTPATIENT_CLINIC_OR_DEPARTMENT_OTHER): Payer: Self-pay

## 2024-07-11 MED ORDER — SULFACETAMIDE SODIUM-SULFUR 10-5 % EX LIQD
1.0000 | Freq: Two times a day (BID) | CUTANEOUS | 99 refills | Status: AC
  Filled 2024-07-11: qty 180, 30d supply, fill #0
  Filled 2024-07-15: qty 170.3, 30d supply, fill #0

## 2024-07-14 ENCOUNTER — Other Ambulatory Visit (HOSPITAL_BASED_OUTPATIENT_CLINIC_OR_DEPARTMENT_OTHER): Payer: Self-pay

## 2024-07-15 ENCOUNTER — Other Ambulatory Visit (HOSPITAL_BASED_OUTPATIENT_CLINIC_OR_DEPARTMENT_OTHER): Payer: Self-pay

## 2024-07-29 ENCOUNTER — Other Ambulatory Visit (HOSPITAL_BASED_OUTPATIENT_CLINIC_OR_DEPARTMENT_OTHER): Payer: Self-pay

## 2024-07-29 MED ORDER — MELATONIN 5 MG PO TABS
5.0000 mg | ORAL_TABLET | Freq: Every day | ORAL | 0 refills | Status: AC
  Filled 2024-07-29: qty 30, 30d supply, fill #0

## 2024-08-08 ENCOUNTER — Other Ambulatory Visit (HOSPITAL_BASED_OUTPATIENT_CLINIC_OR_DEPARTMENT_OTHER): Payer: Self-pay

## 2024-08-08 MED ORDER — LISDEXAMFETAMINE DIMESYLATE 10 MG PO CAPS
10.0000 mg | ORAL_CAPSULE | Freq: Every day | ORAL | 0 refills | Status: AC
  Filled 2024-08-08: qty 30, 30d supply, fill #0

## 2024-08-11 ENCOUNTER — Other Ambulatory Visit (HOSPITAL_BASED_OUTPATIENT_CLINIC_OR_DEPARTMENT_OTHER): Payer: Self-pay

## 2024-08-11 MED ORDER — ALPRAZOLAM 0.25 MG PO TABS
0.1250 mg | ORAL_TABLET | Freq: Every day | ORAL | 2 refills | Status: AC | PRN
  Filled 2024-08-11: qty 15, 15d supply, fill #0

## 2024-08-21 ENCOUNTER — Other Ambulatory Visit (HOSPITAL_BASED_OUTPATIENT_CLINIC_OR_DEPARTMENT_OTHER): Payer: Self-pay

## 2024-09-01 ENCOUNTER — Other Ambulatory Visit (HOSPITAL_BASED_OUTPATIENT_CLINIC_OR_DEPARTMENT_OTHER): Payer: Self-pay

## 2024-09-01 MED ORDER — LISDEXAMFETAMINE DIMESYLATE 20 MG PO CAPS
20.0000 mg | ORAL_CAPSULE | Freq: Every day | ORAL | 0 refills | Status: AC
  Filled 2024-09-01: qty 30, 30d supply, fill #0

## 2024-09-30 ENCOUNTER — Other Ambulatory Visit (HOSPITAL_BASED_OUTPATIENT_CLINIC_OR_DEPARTMENT_OTHER): Payer: Self-pay

## 2024-09-30 MED ORDER — LISDEXAMFETAMINE DIMESYLATE 30 MG PO CAPS
30.0000 mg | ORAL_CAPSULE | Freq: Every day | ORAL | 0 refills | Status: DC
  Filled 2024-09-30: qty 30, 30d supply, fill #0

## 2024-10-08 ENCOUNTER — Other Ambulatory Visit: Payer: Self-pay

## 2024-10-08 ENCOUNTER — Other Ambulatory Visit (HOSPITAL_BASED_OUTPATIENT_CLINIC_OR_DEPARTMENT_OTHER): Payer: Self-pay

## 2024-10-08 MED ORDER — ATOMOXETINE HCL 60 MG PO CAPS
60.0000 mg | ORAL_CAPSULE | Freq: Every day | ORAL | 12 refills | Status: AC
  Filled 2024-10-08: qty 30, 30d supply, fill #0
  Filled 2024-11-10: qty 30, 30d supply, fill #1
  Filled 2024-12-08: qty 30, 30d supply, fill #2
  Filled 2025-01-05: qty 30, 30d supply, fill #3

## 2024-10-23 ENCOUNTER — Other Ambulatory Visit: Payer: Self-pay | Admitting: *Deleted

## 2024-10-23 DIAGNOSIS — Z1231 Encounter for screening mammogram for malignant neoplasm of breast: Secondary | ICD-10-CM

## 2024-10-30 ENCOUNTER — Other Ambulatory Visit (HOSPITAL_BASED_OUTPATIENT_CLINIC_OR_DEPARTMENT_OTHER): Payer: Self-pay

## 2024-10-30 ENCOUNTER — Other Ambulatory Visit: Payer: Self-pay

## 2024-10-30 MED ORDER — LISDEXAMFETAMINE DIMESYLATE 30 MG PO CAPS
30.0000 mg | ORAL_CAPSULE | Freq: Every day | ORAL | 0 refills | Status: DC
  Filled 2024-10-30: qty 30, 30d supply, fill #0

## 2024-11-04 ENCOUNTER — Other Ambulatory Visit (HOSPITAL_BASED_OUTPATIENT_CLINIC_OR_DEPARTMENT_OTHER): Payer: Self-pay

## 2024-11-04 MED ORDER — MIRTAZAPINE 7.5 MG PO TABS
7.5000 mg | ORAL_TABLET | Freq: Every day | ORAL | 11 refills | Status: AC
  Filled 2024-11-04: qty 30, 30d supply, fill #0
  Filled 2024-12-08: qty 30, 30d supply, fill #1
  Filled 2025-01-05: qty 30, 30d supply, fill #2

## 2024-11-26 ENCOUNTER — Other Ambulatory Visit (HOSPITAL_BASED_OUTPATIENT_CLINIC_OR_DEPARTMENT_OTHER): Payer: Self-pay

## 2024-11-26 MED ORDER — LISDEXAMFETAMINE DIMESYLATE 30 MG PO CAPS
30.0000 mg | ORAL_CAPSULE | Freq: Every day | ORAL | 0 refills | Status: DC
  Filled 2024-11-27: qty 30, 30d supply, fill #0

## 2024-11-27 ENCOUNTER — Other Ambulatory Visit (HOSPITAL_BASED_OUTPATIENT_CLINIC_OR_DEPARTMENT_OTHER): Payer: Self-pay

## 2024-12-08 ENCOUNTER — Other Ambulatory Visit (HOSPITAL_BASED_OUTPATIENT_CLINIC_OR_DEPARTMENT_OTHER): Payer: Self-pay

## 2024-12-08 MED ORDER — OMEPRAZOLE 20 MG PO CPDR
20.0000 mg | DELAYED_RELEASE_CAPSULE | Freq: Two times a day (BID) | ORAL | 1 refills | Status: AC
  Filled 2024-12-08: qty 60, 30d supply, fill #0

## 2024-12-29 ENCOUNTER — Other Ambulatory Visit (HOSPITAL_BASED_OUTPATIENT_CLINIC_OR_DEPARTMENT_OTHER): Payer: Self-pay

## 2024-12-29 MED ORDER — LISDEXAMFETAMINE DIMESYLATE 30 MG PO CAPS
30.0000 mg | ORAL_CAPSULE | Freq: Every day | ORAL | 0 refills | Status: DC
  Filled 2024-12-29: qty 30, 30d supply, fill #0

## 2025-01-20 ENCOUNTER — Ambulatory Visit (HOSPITAL_BASED_OUTPATIENT_CLINIC_OR_DEPARTMENT_OTHER)

## 2025-01-26 ENCOUNTER — Other Ambulatory Visit (HOSPITAL_BASED_OUTPATIENT_CLINIC_OR_DEPARTMENT_OTHER): Payer: Self-pay

## 2025-01-26 ENCOUNTER — Other Ambulatory Visit: Payer: Self-pay

## 2025-01-26 MED ORDER — LISDEXAMFETAMINE DIMESYLATE 30 MG PO CAPS
30.0000 mg | ORAL_CAPSULE | Freq: Every day | ORAL | 0 refills | Status: AC
  Filled 2025-01-26: qty 30, 30d supply, fill #0

## 2025-01-27 ENCOUNTER — Encounter (HOSPITAL_BASED_OUTPATIENT_CLINIC_OR_DEPARTMENT_OTHER): Payer: Self-pay

## 2025-01-27 ENCOUNTER — Ambulatory Visit (HOSPITAL_BASED_OUTPATIENT_CLINIC_OR_DEPARTMENT_OTHER)
Admission: RE | Admit: 2025-01-27 | Discharge: 2025-01-27 | Disposition: A | Source: Ambulatory Visit | Attending: *Deleted

## 2025-01-27 DIAGNOSIS — Z1231 Encounter for screening mammogram for malignant neoplasm of breast: Secondary | ICD-10-CM

## 2025-01-29 ENCOUNTER — Ambulatory Visit
